# Patient Record
Sex: Male | Born: 1935 | Race: White | Hispanic: No | Marital: Married
Health system: Southern US, Community
[De-identification: ages and names within clinical notes are randomized; demographics above are authoritative.]

---

## 1998-12-17 ENCOUNTER — Other Ambulatory Visit: Admission: RE | Admit: 1998-12-17 | Discharge: 1998-12-17 | Payer: Self-pay | Admitting: Urology

## 1999-01-03 ENCOUNTER — Encounter: Admission: RE | Admit: 1999-01-03 | Discharge: 1999-04-03 | Payer: Self-pay | Admitting: Radiation Oncology

## 2001-01-05 ENCOUNTER — Encounter: Payer: Self-pay | Admitting: *Deleted

## 2001-01-07 ENCOUNTER — Encounter (INDEPENDENT_AMBULATORY_CARE_PROVIDER_SITE_OTHER): Payer: Self-pay | Admitting: *Deleted

## 2001-01-07 ENCOUNTER — Inpatient Hospital Stay (HOSPITAL_COMMUNITY): Admission: RE | Admit: 2001-01-07 | Discharge: 2001-01-08 | Payer: Self-pay | Admitting: *Deleted

## 2001-02-10 ENCOUNTER — Encounter: Payer: Self-pay | Admitting: *Deleted

## 2001-02-10 ENCOUNTER — Encounter: Admission: RE | Admit: 2001-02-10 | Discharge: 2001-02-10 | Payer: Self-pay | Admitting: *Deleted

## 2001-04-30 ENCOUNTER — Encounter: Admission: RE | Admit: 2001-04-30 | Discharge: 2001-04-30 | Payer: Self-pay | Admitting: Thoracic Surgery

## 2001-04-30 ENCOUNTER — Encounter: Payer: Self-pay | Admitting: Thoracic Surgery

## 2001-05-12 ENCOUNTER — Encounter: Payer: Self-pay | Admitting: Thoracic Surgery

## 2001-05-14 ENCOUNTER — Ambulatory Visit (HOSPITAL_COMMUNITY): Admission: RE | Admit: 2001-05-14 | Discharge: 2001-05-14 | Payer: Self-pay | Admitting: Thoracic Surgery

## 2001-05-14 ENCOUNTER — Encounter (INDEPENDENT_AMBULATORY_CARE_PROVIDER_SITE_OTHER): Payer: Self-pay | Admitting: Specialist

## 2001-05-14 ENCOUNTER — Encounter (INDEPENDENT_AMBULATORY_CARE_PROVIDER_SITE_OTHER): Payer: Self-pay

## 2001-10-28 ENCOUNTER — Encounter: Payer: Self-pay | Admitting: Thoracic Surgery

## 2001-10-28 ENCOUNTER — Encounter: Admission: RE | Admit: 2001-10-28 | Discharge: 2001-10-28 | Payer: Self-pay | Admitting: Thoracic Surgery

## 2002-01-26 ENCOUNTER — Encounter: Admission: RE | Admit: 2002-01-26 | Discharge: 2002-01-26 | Payer: Self-pay | Admitting: Thoracic Surgery

## 2002-01-26 ENCOUNTER — Encounter: Payer: Self-pay | Admitting: Thoracic Surgery

## 2002-07-29 ENCOUNTER — Encounter: Admission: RE | Admit: 2002-07-29 | Discharge: 2002-07-29 | Payer: Self-pay | Admitting: Thoracic Surgery

## 2002-07-29 ENCOUNTER — Encounter: Payer: Self-pay | Admitting: Thoracic Surgery

## 2003-12-22 ENCOUNTER — Inpatient Hospital Stay (HOSPITAL_BASED_OUTPATIENT_CLINIC_OR_DEPARTMENT_OTHER): Admission: RE | Admit: 2003-12-22 | Discharge: 2003-12-22 | Payer: Self-pay | Admitting: Cardiology

## 2004-02-05 ENCOUNTER — Inpatient Hospital Stay (HOSPITAL_COMMUNITY): Admission: RE | Admit: 2004-02-05 | Discharge: 2004-02-17 | Payer: Self-pay | Admitting: Surgery

## 2004-03-05 ENCOUNTER — Encounter: Admission: RE | Admit: 2004-03-05 | Discharge: 2004-03-05 | Payer: Self-pay | Admitting: Surgery

## 2004-03-07 ENCOUNTER — Ambulatory Visit: Payer: Self-pay | Admitting: Internal Medicine

## 2004-03-11 ENCOUNTER — Encounter (HOSPITAL_COMMUNITY): Admission: RE | Admit: 2004-03-11 | Discharge: 2004-06-09 | Payer: Self-pay | Admitting: Cardiology

## 2004-03-11 ENCOUNTER — Ambulatory Visit: Payer: Self-pay | Admitting: Cardiology

## 2004-03-14 ENCOUNTER — Ambulatory Visit: Payer: Self-pay | Admitting: Cardiology

## 2004-03-14 ENCOUNTER — Ambulatory Visit: Payer: Self-pay | Admitting: Internal Medicine

## 2004-03-20 ENCOUNTER — Ambulatory Visit (HOSPITAL_COMMUNITY): Admission: RE | Admit: 2004-03-20 | Discharge: 2004-03-20 | Payer: Self-pay | Admitting: Cardiology

## 2004-03-21 ENCOUNTER — Inpatient Hospital Stay (HOSPITAL_COMMUNITY): Admission: EM | Admit: 2004-03-21 | Discharge: 2004-03-25 | Payer: Self-pay | Admitting: Emergency Medicine

## 2004-03-21 ENCOUNTER — Ambulatory Visit: Payer: Self-pay | Admitting: Cardiology

## 2004-03-29 ENCOUNTER — Ambulatory Visit: Payer: Self-pay | Admitting: Internal Medicine

## 2004-03-29 ENCOUNTER — Ambulatory Visit: Payer: Self-pay | Admitting: *Deleted

## 2004-04-11 ENCOUNTER — Ambulatory Visit: Payer: Self-pay | Admitting: Cardiology

## 2004-05-02 ENCOUNTER — Ambulatory Visit: Payer: Self-pay | Admitting: *Deleted

## 2004-05-30 ENCOUNTER — Ambulatory Visit: Payer: Self-pay | Admitting: *Deleted

## 2004-06-07 ENCOUNTER — Ambulatory Visit: Payer: Self-pay | Admitting: Cardiology

## 2004-06-10 ENCOUNTER — Encounter (HOSPITAL_COMMUNITY): Admission: RE | Admit: 2004-06-10 | Discharge: 2004-09-08 | Payer: Self-pay | Admitting: Cardiology

## 2004-06-12 ENCOUNTER — Ambulatory Visit: Payer: Self-pay | Admitting: Cardiology

## 2004-06-24 ENCOUNTER — Ambulatory Visit: Payer: Self-pay | Admitting: *Deleted

## 2004-06-24 ENCOUNTER — Ambulatory Visit: Payer: Self-pay

## 2004-07-04 ENCOUNTER — Ambulatory Visit: Payer: Self-pay | Admitting: Internal Medicine

## 2004-07-18 ENCOUNTER — Ambulatory Visit: Payer: Self-pay | Admitting: Cardiology

## 2004-08-08 ENCOUNTER — Ambulatory Visit: Payer: Self-pay | Admitting: Internal Medicine

## 2004-08-19 ENCOUNTER — Ambulatory Visit: Payer: Self-pay | Admitting: Cardiology

## 2004-08-19 ENCOUNTER — Ambulatory Visit: Payer: Self-pay | Admitting: *Deleted

## 2004-09-09 ENCOUNTER — Ambulatory Visit: Payer: Self-pay | Admitting: Cardiology

## 2004-10-07 ENCOUNTER — Ambulatory Visit: Payer: Self-pay | Admitting: Cardiology

## 2004-10-29 ENCOUNTER — Ambulatory Visit: Payer: Self-pay | Admitting: Family Medicine

## 2004-11-04 ENCOUNTER — Ambulatory Visit: Payer: Self-pay | Admitting: Cardiology

## 2004-11-25 ENCOUNTER — Ambulatory Visit: Payer: Self-pay | Admitting: Internal Medicine

## 2004-12-03 ENCOUNTER — Ambulatory Visit: Payer: Self-pay

## 2004-12-03 ENCOUNTER — Ambulatory Visit: Payer: Self-pay | Admitting: Cardiology

## 2004-12-03 ENCOUNTER — Ambulatory Visit: Payer: Self-pay | Admitting: Internal Medicine

## 2004-12-06 ENCOUNTER — Inpatient Hospital Stay (HOSPITAL_COMMUNITY): Admission: AD | Admit: 2004-12-06 | Discharge: 2004-12-10 | Payer: Self-pay | Admitting: Cardiology

## 2004-12-09 ENCOUNTER — Ambulatory Visit: Payer: Self-pay | Admitting: Internal Medicine

## 2004-12-17 ENCOUNTER — Ambulatory Visit: Payer: Self-pay | Admitting: Cardiology

## 2004-12-25 ENCOUNTER — Ambulatory Visit: Payer: Self-pay

## 2004-12-25 ENCOUNTER — Ambulatory Visit: Payer: Self-pay | Admitting: Internal Medicine

## 2004-12-31 ENCOUNTER — Ambulatory Visit: Payer: Self-pay | Admitting: Cardiology

## 2005-01-21 ENCOUNTER — Ambulatory Visit: Payer: Self-pay | Admitting: Cardiology

## 2005-01-28 ENCOUNTER — Encounter: Admission: RE | Admit: 2005-01-28 | Discharge: 2005-01-28 | Payer: Self-pay | Admitting: Surgery

## 2005-01-31 ENCOUNTER — Ambulatory Visit (HOSPITAL_COMMUNITY)
Admission: RE | Admit: 2005-01-31 | Discharge: 2005-01-31 | Payer: Self-pay | Admitting: Physical Medicine and Rehabilitation

## 2005-02-19 ENCOUNTER — Ambulatory Visit: Payer: Self-pay | Admitting: Cardiology

## 2005-03-11 ENCOUNTER — Encounter: Admission: RE | Admit: 2005-03-11 | Discharge: 2005-03-11 | Payer: Self-pay | Admitting: Surgery

## 2005-03-19 ENCOUNTER — Ambulatory Visit: Payer: Self-pay | Admitting: Cardiology

## 2005-03-31 ENCOUNTER — Ambulatory Visit: Payer: Self-pay | Admitting: Family Medicine

## 2005-04-16 ENCOUNTER — Ambulatory Visit: Payer: Self-pay | Admitting: Cardiology

## 2005-04-22 ENCOUNTER — Ambulatory Visit: Payer: Self-pay | Admitting: Internal Medicine

## 2005-04-25 ENCOUNTER — Ambulatory Visit: Payer: Self-pay | Admitting: Cardiology

## 2005-05-02 ENCOUNTER — Ambulatory Visit: Payer: Self-pay | Admitting: Cardiology

## 2005-05-12 ENCOUNTER — Ambulatory Visit: Payer: Self-pay | Admitting: Cardiology

## 2005-05-26 ENCOUNTER — Ambulatory Visit: Payer: Self-pay | Admitting: Cardiology

## 2005-05-27 ENCOUNTER — Ambulatory Visit: Payer: Self-pay | Admitting: Internal Medicine

## 2005-05-30 ENCOUNTER — Ambulatory Visit: Payer: Self-pay | Admitting: Internal Medicine

## 2005-05-30 ENCOUNTER — Ambulatory Visit (HOSPITAL_COMMUNITY): Admission: RE | Admit: 2005-05-30 | Discharge: 2005-05-30 | Payer: Self-pay | Admitting: Internal Medicine

## 2005-06-05 ENCOUNTER — Ambulatory Visit: Payer: Self-pay | Admitting: Cardiology

## 2005-06-18 ENCOUNTER — Ambulatory Visit: Payer: Self-pay | Admitting: Internal Medicine

## 2005-06-19 ENCOUNTER — Ambulatory Visit: Payer: Self-pay | Admitting: Internal Medicine

## 2005-06-20 ENCOUNTER — Ambulatory Visit: Payer: Self-pay

## 2005-07-10 ENCOUNTER — Ambulatory Visit: Payer: Self-pay | Admitting: Cardiology

## 2005-07-16 ENCOUNTER — Ambulatory Visit: Payer: Self-pay | Admitting: Family Medicine

## 2005-07-22 ENCOUNTER — Ambulatory Visit: Payer: Self-pay | Admitting: Cardiology

## 2005-07-22 ENCOUNTER — Ambulatory Visit: Payer: Self-pay | Admitting: Family Medicine

## 2005-07-24 ENCOUNTER — Ambulatory Visit: Payer: Self-pay | Admitting: Gastroenterology

## 2005-08-05 ENCOUNTER — Ambulatory Visit: Payer: Self-pay | Admitting: Cardiology

## 2005-08-12 ENCOUNTER — Ambulatory Visit: Payer: Self-pay | Admitting: Cardiology

## 2005-08-13 ENCOUNTER — Ambulatory Visit: Payer: Self-pay | Admitting: *Deleted

## 2005-08-14 ENCOUNTER — Ambulatory Visit: Payer: Self-pay | Admitting: Gastroenterology

## 2005-08-20 ENCOUNTER — Ambulatory Visit: Payer: Self-pay | Admitting: Cardiovascular Disease

## 2005-08-20 ENCOUNTER — Ambulatory Visit: Payer: Self-pay | Admitting: *Deleted

## 2005-08-20 ENCOUNTER — Ambulatory Visit: Payer: Self-pay | Admitting: Gastroenterology

## 2005-09-03 ENCOUNTER — Ambulatory Visit: Payer: Self-pay | Admitting: Critical Care Medicine

## 2005-09-04 ENCOUNTER — Ambulatory Visit: Payer: Self-pay | Admitting: Cardiology

## 2005-09-08 ENCOUNTER — Ambulatory Visit: Payer: Self-pay | Admitting: Cardiology

## 2005-09-18 ENCOUNTER — Ambulatory Visit: Payer: Self-pay | Admitting: Gastroenterology

## 2005-09-18 ENCOUNTER — Ambulatory Visit: Payer: Self-pay | Admitting: Critical Care Medicine

## 2005-09-25 ENCOUNTER — Ambulatory Visit: Payer: Self-pay | Admitting: Internal Medicine

## 2005-09-30 ENCOUNTER — Ambulatory Visit: Payer: Self-pay | Admitting: Cardiology

## 2005-10-07 ENCOUNTER — Ambulatory Visit (HOSPITAL_COMMUNITY): Admission: RE | Admit: 2005-10-07 | Discharge: 2005-10-07 | Payer: Self-pay | Admitting: Internal Medicine

## 2005-10-07 ENCOUNTER — Ambulatory Visit: Payer: Self-pay | Admitting: Internal Medicine

## 2005-10-20 ENCOUNTER — Ambulatory Visit: Payer: Self-pay | Admitting: Critical Care Medicine

## 2005-10-28 ENCOUNTER — Ambulatory Visit: Payer: Self-pay | Admitting: Cardiovascular Disease

## 2005-11-04 ENCOUNTER — Ambulatory Visit: Payer: Self-pay | Admitting: Internal Medicine

## 2005-11-13 ENCOUNTER — Ambulatory Visit: Payer: Self-pay

## 2005-11-17 ENCOUNTER — Ambulatory Visit: Payer: Self-pay | Admitting: Internal Medicine

## 2005-11-17 ENCOUNTER — Inpatient Hospital Stay (HOSPITAL_COMMUNITY): Admission: RE | Admit: 2005-11-17 | Discharge: 2005-11-18 | Payer: Self-pay | Admitting: Internal Medicine

## 2005-11-25 ENCOUNTER — Ambulatory Visit: Payer: Self-pay | Admitting: Cardiology

## 2005-11-26 ENCOUNTER — Ambulatory Visit: Payer: Self-pay | Admitting: Internal Medicine

## 2005-11-28 ENCOUNTER — Ambulatory Visit: Payer: Self-pay | Admitting: Gastroenterology

## 2005-12-10 ENCOUNTER — Ambulatory Visit: Payer: Self-pay | Admitting: Internal Medicine

## 2005-12-10 ENCOUNTER — Ambulatory Visit: Payer: Self-pay | Admitting: Cardiology

## 2005-12-19 ENCOUNTER — Ambulatory Visit: Payer: Self-pay | Admitting: Cardiology

## 2005-12-24 ENCOUNTER — Ambulatory Visit: Payer: Self-pay | Admitting: Cardiology

## 2005-12-29 ENCOUNTER — Ambulatory Visit: Payer: Self-pay | Admitting: Internal Medicine

## 2005-12-29 ENCOUNTER — Ambulatory Visit: Payer: Self-pay | Admitting: Cardiology

## 2005-12-29 ENCOUNTER — Ambulatory Visit: Payer: Self-pay

## 2005-12-31 ENCOUNTER — Ambulatory Visit: Payer: Self-pay | Admitting: Internal Medicine

## 2006-01-07 ENCOUNTER — Ambulatory Visit (HOSPITAL_COMMUNITY): Admission: RE | Admit: 2006-01-07 | Discharge: 2006-01-07 | Payer: Self-pay | Admitting: Internal Medicine

## 2006-01-08 ENCOUNTER — Ambulatory Visit: Payer: Self-pay | Admitting: Cardiology

## 2006-01-29 ENCOUNTER — Ambulatory Visit: Payer: Self-pay | Admitting: Cardiology

## 2006-02-12 ENCOUNTER — Ambulatory Visit: Payer: Self-pay | Admitting: Internal Medicine

## 2006-02-25 ENCOUNTER — Ambulatory Visit: Payer: Self-pay | Admitting: Internal Medicine

## 2006-02-25 ENCOUNTER — Ambulatory Visit: Payer: Self-pay | Admitting: *Deleted

## 2006-03-06 ENCOUNTER — Ambulatory Visit: Payer: Self-pay | Admitting: Gastroenterology

## 2006-03-06 LAB — CONVERTED CEMR LAB
ALT: 24 units/L (ref 0–40)
Bilirubin, Direct: 0.3 mg/dL (ref 0.0–0.3)
Total Bilirubin: 1 mg/dL (ref 0.3–1.2)

## 2006-03-10 ENCOUNTER — Ambulatory Visit: Payer: Self-pay | Admitting: Gastroenterology

## 2006-03-18 ENCOUNTER — Ambulatory Visit: Payer: Self-pay | Admitting: Cardiology

## 2006-04-08 ENCOUNTER — Ambulatory Visit: Payer: Self-pay | Admitting: Cardiology

## 2006-04-17 ENCOUNTER — Ambulatory Visit (HOSPITAL_BASED_OUTPATIENT_CLINIC_OR_DEPARTMENT_OTHER): Admission: RE | Admit: 2006-04-17 | Discharge: 2006-04-17 | Payer: Self-pay | Admitting: Critical Care Medicine

## 2006-05-01 ENCOUNTER — Ambulatory Visit: Payer: Self-pay | Admitting: Cardiology

## 2006-05-14 ENCOUNTER — Ambulatory Visit: Payer: Self-pay | Admitting: Pulmonary Disease

## 2006-05-14 ENCOUNTER — Ambulatory Visit: Payer: Self-pay | Admitting: Cardiology

## 2006-05-21 ENCOUNTER — Ambulatory Visit: Payer: Self-pay

## 2006-05-21 ENCOUNTER — Ambulatory Visit: Payer: Self-pay | Admitting: *Deleted

## 2006-05-25 ENCOUNTER — Ambulatory Visit: Payer: Self-pay | Admitting: Pulmonary Disease

## 2006-05-29 ENCOUNTER — Ambulatory Visit: Payer: Self-pay | Admitting: Cardiology

## 2006-06-17 ENCOUNTER — Ambulatory Visit: Payer: Self-pay | Admitting: Pulmonary Disease

## 2006-06-17 ENCOUNTER — Ambulatory Visit (HOSPITAL_BASED_OUTPATIENT_CLINIC_OR_DEPARTMENT_OTHER): Admission: RE | Admit: 2006-06-17 | Discharge: 2006-06-17 | Payer: Self-pay | Admitting: Pulmonary Disease

## 2006-06-19 ENCOUNTER — Ambulatory Visit: Payer: Self-pay | Admitting: Internal Medicine

## 2006-06-22 ENCOUNTER — Ambulatory Visit: Payer: Self-pay | Admitting: Cardiology

## 2006-07-10 ENCOUNTER — Ambulatory Visit: Payer: Self-pay | Admitting: Cardiology

## 2006-07-20 ENCOUNTER — Ambulatory Visit: Payer: Self-pay | Admitting: Pulmonary Disease

## 2006-08-07 ENCOUNTER — Ambulatory Visit: Payer: Self-pay | Admitting: Cardiology

## 2006-08-20 ENCOUNTER — Ambulatory Visit: Payer: Self-pay | Admitting: Family Medicine

## 2006-08-20 LAB — CONVERTED CEMR LAB
ALT: 25 units/L (ref 0–40)
Albumin: 3 g/dL — ABNORMAL LOW (ref 3.5–5.2)
BUN: 27 mg/dL — ABNORMAL HIGH (ref 6–23)
Basophils Absolute: 0 10*3/uL (ref 0.0–0.1)
Bilirubin, Direct: 0.3 mg/dL (ref 0.0–0.3)
CO2: 37 meq/L — ABNORMAL HIGH (ref 19–32)
Chloride: 95 meq/L — ABNORMAL LOW (ref 96–112)
Creatinine, Ser: 1.5 mg/dL (ref 0.4–1.5)
Eosinophils Relative: 1 % (ref 0.0–5.0)
Ferritin: 127.7 ng/mL (ref 22.0–322.0)
Folate: 19.9 ng/mL
GFR calc Af Amer: 57 mL/min
Hemoglobin: 10.8 g/dL — ABNORMAL LOW (ref 13.0–17.0)
MCHC: 33.9 g/dL (ref 30.0–36.0)
MCV: 89.3 fL (ref 78.0–100.0)
Monocytes Absolute: 0.5 10*3/uL (ref 0.2–0.7)
Monocytes Relative: 9.4 % (ref 3.0–11.0)
Neutro Abs: 4.5 10*3/uL (ref 1.4–7.7)
Neutrophils Relative %: 78.7 % — ABNORMAL HIGH (ref 43.0–77.0)
Potassium: 4.9 meq/L (ref 3.5–5.1)
Sodium: 138 meq/L (ref 135–145)
TSH: 3.51 microintl units/mL (ref 0.35–5.50)
Total Bilirubin: 0.8 mg/dL (ref 0.3–1.2)
Total Protein: 6.8 g/dL (ref 6.0–8.3)

## 2006-08-21 ENCOUNTER — Ambulatory Visit: Payer: Self-pay | Admitting: Cardiology

## 2006-08-26 ENCOUNTER — Ambulatory Visit: Payer: Self-pay | Admitting: Family Medicine

## 2006-08-26 LAB — CONVERTED CEMR LAB
LDL Cholesterol: 100 mg/dL — ABNORMAL HIGH (ref 0–99)
VLDL: 9 mg/dL (ref 0–40)

## 2006-08-27 ENCOUNTER — Ambulatory Visit: Payer: Self-pay | Admitting: Oncology

## 2006-08-31 ENCOUNTER — Ambulatory Visit: Payer: Self-pay | Admitting: Family Medicine

## 2006-09-07 ENCOUNTER — Ambulatory Visit: Payer: Self-pay | Admitting: Cardiovascular Disease

## 2006-09-07 ENCOUNTER — Ambulatory Visit: Payer: Self-pay | Admitting: Cardiology

## 2006-09-22 ENCOUNTER — Ambulatory Visit: Payer: Self-pay

## 2006-09-29 ENCOUNTER — Ambulatory Visit: Payer: Self-pay | Admitting: Cardiology

## 2006-10-01 ENCOUNTER — Ambulatory Visit: Payer: Self-pay | Admitting: Cardiology

## 2006-10-12 ENCOUNTER — Ambulatory Visit: Payer: Self-pay | Admitting: Cardiovascular Disease

## 2006-10-12 LAB — CONVERTED CEMR LAB
BUN: 35 mg/dL — ABNORMAL HIGH (ref 6–23)
Creatinine, Ser: 1.5 mg/dL (ref 0.4–1.5)
Glucose, Bld: 89 mg/dL (ref 70–99)
Potassium: 4.5 meq/L (ref 3.5–5.1)

## 2006-10-16 ENCOUNTER — Ambulatory Visit: Payer: Self-pay | Admitting: Internal Medicine

## 2006-10-16 ENCOUNTER — Inpatient Hospital Stay (HOSPITAL_COMMUNITY): Admission: EM | Admit: 2006-10-16 | Discharge: 2006-10-21 | Payer: Self-pay | Admitting: Emergency Medicine

## 2006-11-03 DEATH — deceased

## 2007-07-29 IMAGING — PT NM PET TUM IMG SKULL BASE T - THIGH
4 series · 25 of 25 positions shown · non-contrast
Comparison: CT dated 01/28/05.

CLINICAL DATA: Bilateral lung nodules.  Lung mass.
 FDG PET-CT TUMOR IMAGING (SKULL BASE TO THIGHS) ? 01/31/05: 
 Fasting Blood Glucose:  88.
TECHNIQUE: 16.2 mCi F18-FDG were administered via the right antecubital fossa.  Full ring PET imaging was performed from the skull base through the mid-thighs 64 minutes after injection.  CT data was obtained and used for attenuation correction and anatomic localization only.  (This was not acquired as a diagnostic CT examination.)

[Series 1: pet ac · axial · 3.3mm · 4.69mm/px · z∈[-870,+0]mm · 8 of 267 slices shown]
[im 1/267]
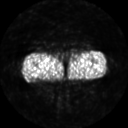
[im 39/267]
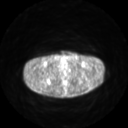
[im 77/267]
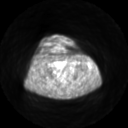
[im 115/267]
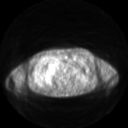
[im 153/267]
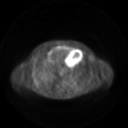
[im 191/267]
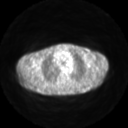
[im 229/267]
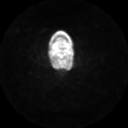
[im 267/267]
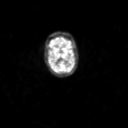

[Series 2: pet nac · axial · 3.3mm · 4.69mm/px · z∈[-870,+0]mm · 8 of 267 slices shown]
[im 1/267]
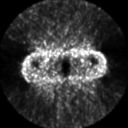
[im 39/267]
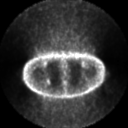
[im 77/267]
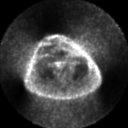
[im 115/267]
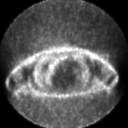
[im 153/267]
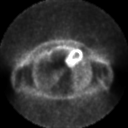
[im 191/267]
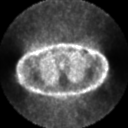
[im 229/267]
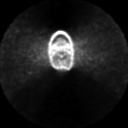
[im 267/267]
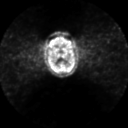

[Series 2: ct images · axial · 3.8mm · 0.98mm/px · z∈[-870,+0]mm · 8 of 267 slices shown]
[im 1/267]
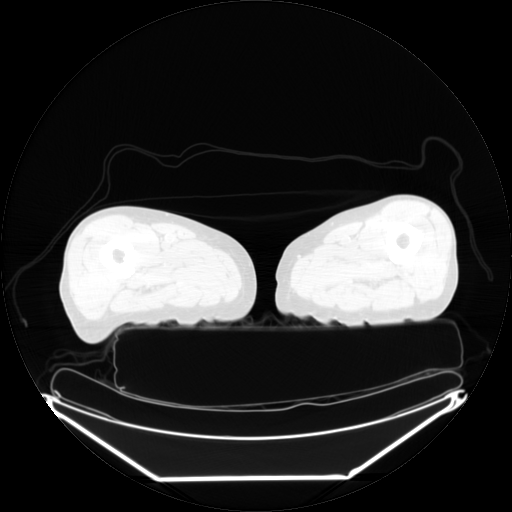
[im 39/267]
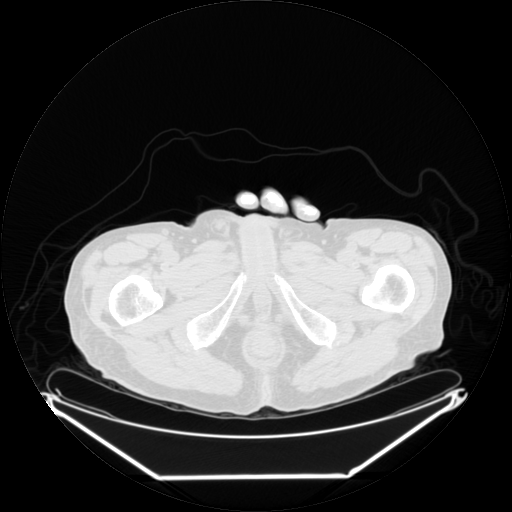
[im 77/267]
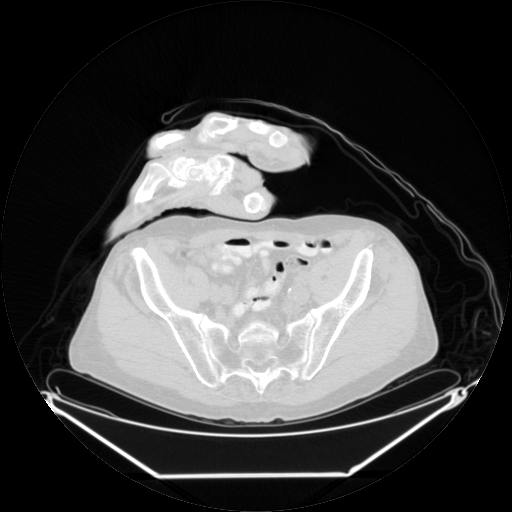
[im 115/267]
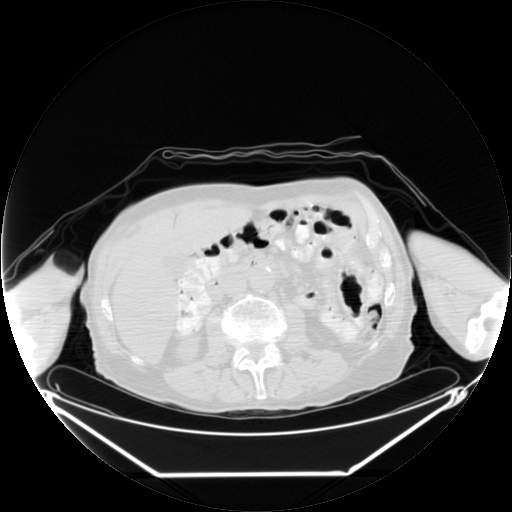
[im 153/267]
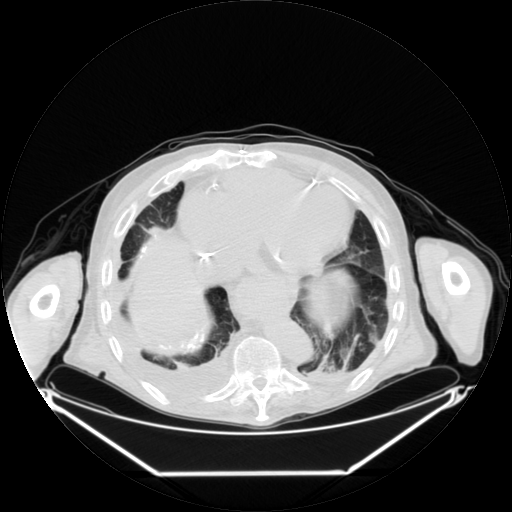
[im 191/267]
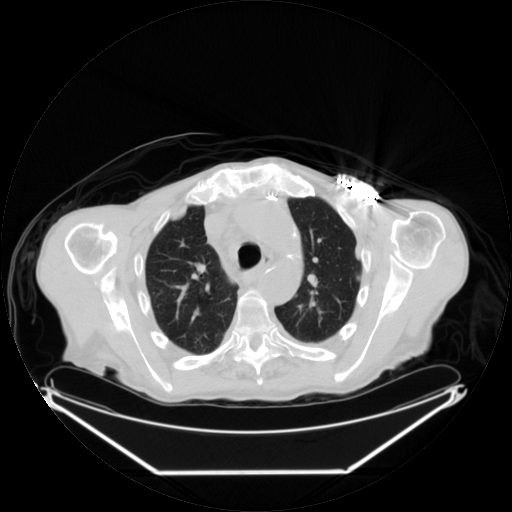
[im 229/267]
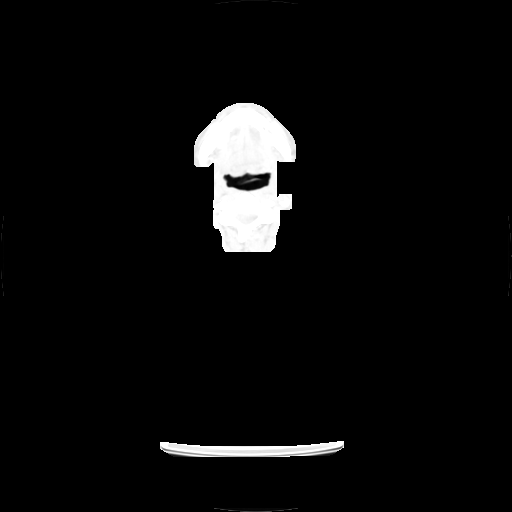
[im 267/267  brain]
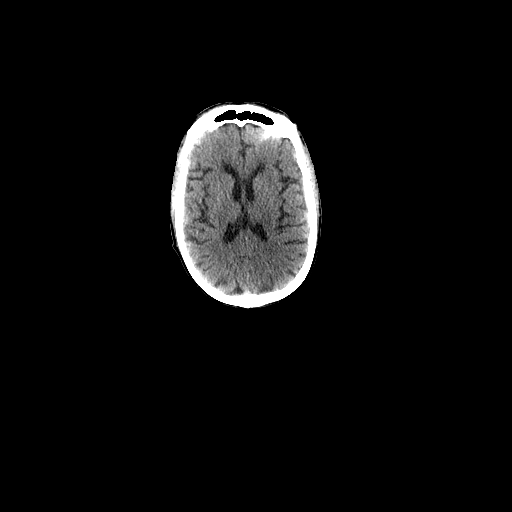

[Series 123: mip · coronal · 3.3mm · 4.69mm/px · 1 of 30 slices shown]
[im 1/30]
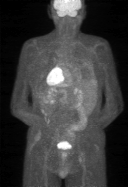

[25 of 25 positions shown; findings below may reference images not displayed]

FINDINGS: No hypermetabolic lymph nodes within the soft tissues of the neck.  
 No hypermetabolic axillary lymphadenopathy. 
 A right paratracheal lymph node demonstrates no significant FDG uptake.  
 AP window lymph nodes are noted.  These demonstrate mild nonspecific FDG uptake with an SUV-max equal to 3.8.  
 No hypermetabolic hilar lymphadenopathy.
 Bilateral pleural effusions, right greater than left, are noted.  No abnormal uptake within the effusions are identified to suggest that these are malignant effusions.
 Linear increased radiotracer uptake involving the right pleura is noted.  The SUV for this equals 3.0.  This is likely the sequela of chronic inflammation possibly due to asbestos exposure.  There are no focal areas of more greater increased uptake. 
 No abnormal uptake in the liver is noted. 
 Mild nonspecific FDG uptake is identified within the left adrenal gland.
IMPRESSION: 1.  No hypermetabolic pulmonary nodule or mass is noted.  
 2.  Increased FDG uptake along the pleura of the right lung.  There is a medium-size pleural effusion overlying this area.  The pleural effusion does not have any significant FDG uptake though.  I suspect, given the calcified pleural plaques throughout both lungs, this patient has had exposure to asbestos.  This FDG uptake may be related to long-standing inflammation as a result of this exposure.  However, if this patient?s pleural effusion does not resolve, it may be of value to perform a histologic analysis of the effusion to ensure that there are no tumor cells which may suggest an early mesothelioma.

## 2010-09-17 NOTE — Assessment & Plan Note (Signed)
[facility]                            CARDIOLOGY OFFICE NOTE   NAME:[name] [name] RENDLEMAN                       MRN:          [id number]  DATE:10/01/2006                            DOB:          Nov 23, 1918    Mr. [name] [name] comes in today for close followup and management of the  following issues:  1. Right-sided congestive heart failure with increasing edema, fatigue      and shortness of breath.  A repeat 2D echocardiogram on Sep 22, 2006, confirms normal left ventricular systolic function, moderate      aortic stenosis with a mean gradient of 35, mild mitral      regurgitation, marked enlargement of left atrium, RV dilatation      with reduction RV function, markedly increased pulmonary pressures,      severe TR and marked enlargement of the right atrium.  His swelling      is better with b.i.d. Lasix but it keeps him up at night.  He is      not wearing his CPAP for his apnea.  2. Coronary artery disease.  Currently stable with good LV function.  3. Moderate aortic stenosis which may be part of the reason he is      short of breath but not the primary.  4. Atrial fibrillation.  He is AV paced and is currently controlled      with amiodarone and Coumadin.  5. Chronic anemia.  6. Obstructive sleep apnea.  He does not enjoy wearing a CPAP because      of having to get up during the night.   He is absolutely fatigued.  Even shaving makes him short of breath.   His edema is better as mentioned above.   His wife asked about O2 at night but his room air O2 saturation is 94%.  I made it clear to her that he would not meet criteria for Medicare and  it was extraordinarily extensive.  He would like to stick with the CPAP  for present, which he promises to wear.   His medications are unchanged as outlined in a note Sep 07, 2006.   PHYSICAL EXAMINATION:  He is pale.  SKIN:  Warm and dry.  NECK:  Really does not show any significant jugular venous  distention at  30 degrees.  His respiratory rate is 18 and unlabored.  Carotids are  full with bilateral referred sounds.  Thyroid is not enlarged.  Trachea  is midline.  LUNGS:  Reveal decreased breath sounds bilaterally right worse than  left.  This is stable.  HEART:  Reveals an aortic stenosis murmur.  There is no gallop.  There  is a right ventricular lift.  ABDOMEN:  Soft.  Liver is not enlarged.  There are good bowel sounds.  EXTREMITIES:  Reveal 1+ edema on the left; none on the right, which is  the best I have seen it.  Pulses are present but reduced.  NEUROLOGICAL EXAMINATION:  Grossly intact.   EKG shows AV sequential pacing with no changes.   I have  spent a good 20 to 30 minutes talking to Mr. And Mrs. [name] [name]  again.  He and she are very frustrated that he is getting no better.  They are pleased that his swelling is better.   I have made it clear there is very little to do about right ventricular  failure and right-sided failure other than what we are doing.  We will  add Lanoxin 0.125 mg a day, and I have encouraged him to wear a CPAP  every night.  I have asked him to take his p.m. dose of Lasix earlier in  the afternoon to avoid to limit nocturia.  I will see him back in four  weeks.     Thomas C. Daleen Squibb, MD, [facility]  Electronically Signed    TCW/MedQ  DD: 10/01/2006  DT: 10/01/2006  Job #: [id number]   cc:   [name] Senior A. Clent Ridges, MD

## 2010-09-17 NOTE — H&P (Signed)
[name], [name] NO.:  [id number]   MEDICAL RECORD NO.:  [id number]          PATIENT TYPE:  INP   LOCATION:  1824                         FACILITY:  [facility]   PHYSICIAN:  Hollice Espy, M.D.DATE OF BIRTH:  1918/09/20   DATE OF ADMISSION:  10/15/2006  DATE OF DISCHARGE:                              HISTORY & PHYSICAL   PRIMARY CARE PHYSICIAN:  Tera Mater. Clent Ridges, M.D.   CARDIOLOGIST:  Jesse Sans. Wall, MD, [facility].   CHIEF COMPLAINT:  Weakness.   HISTORY OF PRESENT ILLNESS:  The patient is an 77 year old white male  with past medical history of atrial fibrillation, status post pacer,  right-sided congestive heart failure, with moderate aortic stenosis and  severe tricuspid regurgitation, CAD, and chronic anemia who has been  having problems with increased lower extremity swelling to the point  where his Lasix was recently doubled about one to two months ago.  Since  that time, he reports more so in the last week that he has been having  problems with weakening to the point where he cannot even get out of  bed.  He reports some continued shortness of breath but this does not  appear to be a major complaint.  He says that his cough is usually of  whitish sputum but usually nonproductive.  He does not note any fevers  or chills.  He finally got to the point where he just could not even  barely move and he came to the emergency room for further evaluation.  When the patient presented here, he was noted to have a 3-4+ pitting  edema of the lower extremity which has been a chronic issue for him.  However, concerning was his blood work which showed a normal white count  of 8.1 but with an 82% shift. He was also noted to have a sodium of 128,  CO2 above 35 and a BUN of 43 with a creatinine of 1.5.  Cardiac markers  are unremarkable.  A chest x-ray showed signs of a __________ one-sided  pleural effusion but also bibasilar atelectasis and a possible right  lower lobe  infiltrate.  In discussion with the patient, his biggest  complaint is just fatigue and weakness.  He denies any headaches, vision  changes, chest pain, or palpitations.  He did have chronic shortness of  breath and dyspnea on exertion with some nonproductive coughing.  No  wheezing.  He denies any abdominal pain. His bowels have not moved for  several days, but had a large bowel  movement today.  He denies any  diarrhea.  He denies any hematuria or dysuria.  He does complain of  bilateral lower extremity swelling. His review of systems is otherwise  negative.   PAST MEDICAL HISTORY:  1. CHF.  2. Atrial fibrillation.  3. Pacemaker.  4. Gout.  5. Severe tricuspid regurgitation.  6. Moderate aortic stenosis.  7. GERD.  8. The patient also has history of obstructive sleep apnea on CPAP.   MEDICATIONS:  Based on the [facility] note from Sep 07, 2006, he  is on:  1.  Prilosec 30 mg.  2. Coreg 6.25 mg p.o. b.i.d.  3. Amiodarone 200 mg p.o. daily.  4. Aspirin 81 mg p.o. daily  5. Coumadin as directed.  The last recorded Coumadin level that I have      is 5 mg per day on his discharge from last year.  6. Iron sulfate 275 mg p.o. b.i.d.  7. Digoxin 0.125 mg p.o. daily  8. Ocuvite p.o. daily/  9. Multivitamins p.o. daily.  10.Metamucil p.o. daily.  11.K-Dur 20 mEq p.o. daily.  12.MiraLax p.o. daily.  13.Lasix 40 mg p.o. b.i.d.   ALLERGIES:  INDOMETHACIN.   SOCIAL HISTORY:  He denies tobacco, alcohol or drug use.  He lives at  home with his wife.   FAMILY HISTORY:  Noncontributory.   PHYSICAL EXAMINATION:  VITAL SIGNS:  On admission, temperature 97.5,  heart rate 60, blood pressure 179/72, respirations 18, oxygen saturation  88% on room air, 93% on 2 L.  GENERAL:  The patient is alert and oriented x3, but extremely fatigued  in appearance.  HEENT:  Normocephalic, atraumatic.  Mucous membranes are quite dry.  He  has no carotid bruits.  HEART:  Paced rhythm.  S1 and S2  with a 2/6 systolic ejection murmur.  LUNGS:  Decreased breath sounds bilaterally at the bases.  ABDOMEN:  Soft, nontender, nondistended.  Decreased bowel sounds.  EXTREMITIES:  From the knees down bilaterally, he has a 3-4+ pitting  edema.   LABORATORY DATA:  White count 8.1, hemoglobin 12.2, hematocrit 36.5, MCV  90. Platelet count 165, 82% neutrophils.  Urinalysis showed a trace  hemoglobin and 30 protein.  Sodium 128, potassium 4.8, chloride 86,  bicarb 35, BUN 43, creatinine 1.49, glucose 104.  LFTs are noted for an  albumin of 3.  His urine micro was essentially unremarkable.  CPK 146,  MB 1.5, troponin-I less than 0.05.  EKG shows a paced rhythm.  Chest x-  ray is as per HPI.   ASSESSMENT/PLAN:  1. Progressive worsening weakness.  I feel  likely this is secondary      to overdiuresis in combination with deconditioning and poor      nutrition.  The cause of his overdiuresis is an increase in his      Lasix.  Would start by admitting the patient.  Will gently hydrate,      get PT/OT involved as well as nutrition consult.  2. Suspected infiltrate.  The patient may have a mild pneumonia      although fever and chills are not his main complaint.  He does have      a possible infiltrate on x-ray.  He does have a shift on his white      count. We will go ahead and treat now, given his co-morbidities,      with IV Avelox.  3. Acute and chronic renal failure.  Again will gently hydrate.  4. History of chronic systolic congestive heart failure.  His history      does not impress me much for acute failure.  For now, until we get      more favored gentle hydrating and holding of Lasix, until his renal      status has improved, we will continue his digoxin.  5. Atrial fibrillation, status post pacer.  Continue amiodarone.  6. Protein/calorie malnutrition.  We will get a nutrition consult.  I     have a feeling that perhaps some of his severe lower extremity  edema may also be from  third spacing as well.      Hollice Espy, M.D.  Electronically Signed     SKK/MEDQ  D:  10/16/2006  T:  10/16/2006  Job:  [id number]   cc:   Jeannett Senior A. Clent Ridges, MD  Jesse Sans. Wall, MD, [facility]

## 2010-09-20 NOTE — Cardiovascular Report (Signed)
NAME:  AZARIAN, STARACE                          ACCOUNT NO.:  [id number]   MEDICAL RECORD NO.:  [id number]                   PATIENT TYPE:  OIB   LOCATION:  6501                                 FACILITY:  [facility]   PHYSICIAN:  Charlies Constable, M.D. [facility]              DATE OF BIRTH:  1918/11/24   DATE OF PROCEDURE:  12/22/2003  DATE OF DISCHARGE:                              CARDIAC CATHETERIZATION   CLINICAL HISTORY:  Mr. Dobratz is 77 years old and has had recent onset chest  pain and exertional angina. Seen in consultation by Dr. Daleen Squibb, and Dr. Daleen Squibb  made arrangements for him to come to the outpatient lab for evaluation with  angiography. He also had some mild aortic insufficiency by echocardiography  and some calcification in the aortic valve as well as a dilated root. He had  a Cardiolite scan which showed some inferior scarring with peri-infarction  ischemia and ejection fraction of 63%.   PROCEDURE:  The procedure was performed in the outpatient laboratory.  Procedure was performed via the right femoral artery using an arterial  sheath and initially 4-French preformed coronary catheters. We had a great  deal of difficulty engaging the left coronary artery due to marked  tortuosity and dilatation of the aortic root and due to rotation of his  aorta changing the take off angle of the left main. We finally had to switch  to 5-French catheters and were able to get a fairly good subselective  injection with a JL 3.5 catheter. Aortic root injection was performed to  assess the patient's dilated aortic root and also his aortic insufficiency.  The patient tolerated the procedure well and left the laboratory in  satisfactory condition.   RESULTS:  Left main coronary artery:  The left main coronary artery had a  70% to 80% lesion with calcifications ostium to the bifurcation.   Left anterior descending artery:  The left anterior descending artery was  heavily calcified. This also gave rise to 2  diagonal branches and 2 septal  perforators. There was 80% narrowing in the proximal LAD visualized on a  subselective injection.   Left circumflex artery:  The left circumflex artery gave raise to a small  marginal branch and a large posterolateral branch. This vessel was also  heavily calcified. There was a 70% ostial lesion and an 80% lesion in the  proximal portion which were visualized on subselective injection.   Right coronary artery:  The right coronary artery is a dominant vessel that  gave rise to 2 right ventricular branches, a posterior descending branch,  and 2 posterolateral branches. The posterior descending branch was diffusely  disease with 70% proximal narrowing and 95% narrowing in the mid to distal  portion. There was a 70% lesion before the second posterior lateral branch.   Left ventriculogram:  Left ventriculogram performed in the RAO projection  showed good wall motion with no areas of  hypokinesis. The estimated ejection  fraction was 60%.   Aortic root injection showed a dilated and tortuous aorta. There was trace  aortic insufficiency.   The aortic pressure was 131/59 with mean of 85 and left ventricular pressure  was 131/18. There was no gradient across the aortic valve at time of pull  back.   CONCLUSION:  Coronary artery disease with 70 to 80% of the left main  coronary artery, 80% stenosis of the proximal left anterior descending  artery, 70% ostial and 80% proximal stenosis in the circumflex artery, and  95% stenosis in the posterior stenosis branch and 70% in the posterolateral  branch of the right coronary artery with normal LV function.   RECOMMENDATIONS:  The only option for revascularization is surgical. The  patient is 77 years old and will be at increased risk. He also has heavily  calcified vessels. I discussed his situation with Dr. Daleen Squibb, and we will  arrange for him to see Dr. Daleen Squibb in the office in followup and discuss  whether we think he  would be a candidate for bypass surgery. In the  meantime, we will continue the aspirin and add Imdur 30 mg daily.                                               Charlies Constable, M.D. [facility]    BB/MEDQ  D:  12/22/2003  T:  12/23/2003  Job:  [id number]   cc:   Thomas C. Wall, M.D.

## 2010-09-20 NOTE — Procedures (Signed)
[name], VICKERS NO.:  [id number]   MEDICAL RECORD NO.:  [id number]          PATIENT TYPE:  OUT   LOCATION:  SLEEP CENTER                 FACILITY:  [facility]   PHYSICIAN:  Coralyn Helling, MD        DATE OF BIRTH:  05-01-1919   DATE OF STUDY:  06/17/2006                            NOCTURNAL POLYSOMNOGRAM   INDICATION FOR STUDY:  This an individual who has history of coronary  disease and hypertension. He had undergone a overnight polysomnogram on  April 17, 2006 and was found to have a AHI of 10 and an oxygen  saturation nadir of 89%. Of note was that he did have a significant REM  affect. He returns to the sleep lab for a CPAP titration study.   EPWORTH SLEEPINESS SCORE:  Eight.   MEDICATIONS:  Prilosec, carvedilol, amiodarone, aspirin, Coumadin,  ferrous sulfate, Ocu-Vite, a multivitamin and Metamucil.   SLEEP ARCHITECTURE:  Total recording time was 426 minutes. Total sleep  time was 158 minutes. Sleep efficiency was 37% which is significantly  reduced. Sleep latency was 4.5 minutes which is reduced. REM latency was  150 minutes which is within normal limits. The study was notable for the  lack of slow wave sleep and a reduction in the percentage of REM sleep  to 2% of the study. The patient slept in both the supine and non supine  position.   RESPIRATORY DATA:  The average respiratory rate was 16. The patient was  titrated from a CPAP pressure setting of 5 to 10 cm of water. At a CPAP  pressure setting of 9 cm of water the apnea/hypopnea index reduced to  4.6. At this pressure setting the patient was observed in both REM sleep  and supine sleep. However his sleep was quite disrupted throughout the  study, particularly in the second half of the study when his pressure  was increased to 10.   OXYGEN DATA:  The baseline oxygenation was 94%. The oxygen saturation  nadir was 83%. At a CPAP pressure setting of 9 cm of water, the mean  oxygenation during non  REM sleep was 92% and during REM sleep was 91%.  The minimal during non REM sleep was 83% and 84% during REM sleep.   CARDIAC DATA:  The average heart rate was 62 and rhythm strip showed  sinus rhythm with PACs.   MOVEMENT-PARASOMNIA:  The periodic movement index was 0. The patient had  two bathroom trips.   IMPRESSIONS-RECOMMENDATIONS:  This was a CPAP titration study.  At a  CPAP pressure setting of 9 cm of water, the apnea/hypopnea index was  reduced to 4.6. The patient was observed in both REM sleep and supine  sleep at this pressure setting; however, sleep architecture was quite  fragmented and as a result total monitor testing time was somewhat  limited. I would recommend starting the patient on CPAP 9 cm of water  and monitoring for his clinical response, then further consideration  could be given to either having him undergo an auto CPAP titration  once he is more acclimatized to the use of CPAP therapy or alternatively  return  to the sleep lab for a repeat CPAP titration study.      Coralyn Helling, MD  Diplomat, American Board of Sleep Medicine  Electronically Signed     VS/MEDQ  D:  06/25/2006 14:58:02  T:  06/25/2006 19:27:20  Job:  [id number]

## 2010-09-20 NOTE — Procedures (Signed)
NAMETAVISH, GETTIS NO.:  [id number]   MEDICAL RECORD NO.:  [id number]          PATIENT TYPE:  OUT   LOCATION:  SLEEP CENTER                 [facility]ACILITY[facility]   PHYSICIAN:  Barbaraann Share, MD,FCCPDATE OF BIRTH:  11/04/20   DATE OF STUDY:  04/17/2006                            NOCTURNAL POLYSOMNOGRAM   REFERRING PHYSICIAN:  Marcelyn Bruins MD   INDICATION FOR STUDY:  Hypersomnia with sleep apnea.   EPWORTH SLEEPINESS SCORE:  10   SLEEP ARCHITECTURE:  The patient had a total sleep time of 331 minutes  with decreased REM and never achieved slow wave sleep. Sleep onset  latency was normal and REM onset was fairly rapid at 57 minutes. Sleep  efficiency was decreased at 76%.   RESPIRATORY DATA:  The patient was found to have 29 hypopnea's and 24  obstructive apnea's for a respiratory disturbance index of approximately  10 events per hour. The events occurred in all body positions and  moderate to loud snoring was noted throughout. The patient was noted to  have extremely fragmented sleep toward the end of the study when his  sleep disorder breathing worsened.   OXYGEN DATA:  Oxygen desaturation as low as 89% with the patient's  obstructive events.   CARDIAC DATA:  The patient had a paced rhythm throughout his study with  no obvious arrhythmia.   MOVEMENT-PARASOMNIA:  None.   IMPRESSIONS-RECOMMENDATIONS:  Mild obstructive sleep apnea/hypopnea  syndrome with a respiratory disturbance index of 10 events per hour and  O2 desaturation as low as 89%. Treatment for this degree of sleep apnea  may include weight loss alone if applicable, upper airway surgery, oral  appliance, and also CPAP.  Even though the patient's RDI is not overly  significant, the patient appears to have very fragmented sleep during  the last half of the night when his sleep disorder breathing worsened. I  recommend a trial of treatment to see if he has significant improvement.  Clinical  correlation is suggested.      Barbaraann Share, MD,FCCP  Diplomate, American Board of Sleep  Medicine    [facility]/MEDQ  D:  05/08/2006 16:58:23  T:  05/08/2006 16:10:96  Job:  [id number]

## 2010-09-20 NOTE — Assessment & Plan Note (Signed)
[facility]                           ELECTROPHYSIOLOGY OFFICE NOTE   NAME:[name], [name]                       MRN:          [id number]  DATE:11/26/2005                            DOB:          [birth year]    [name] returns today for follow-up.  He is a very pleasant elderly man  with a history of a complete heart block and severe fatigue and class III  heart failure who underwent upgrade to a BiV pacemaker approximately 10 days  ago.  He returns today for follow-up.  He had a problem with bleeding and a  hematoma postoperatively as he had been on Coumadin and it was felt that he  should not have his Coumadin stopped if at all possible secondary to  concerns of thromboembolic complications.  Bleeding has stopped and he  returns today for follow-up.  He states that he has felt fatigued and weak  for the last week.  He has also been dizzy.   PHYSICAL EXAMINATION:  GENERAL APPEARANCE:  He is a pleasant elderly man in  no acute distress.  VITAL SIGNS:  The blood pressure today was 126/77, pulse was 93 and  irregular, respirations were 18.  Weight was 152 pounds.  NECK:  No jugular venous distension.  LUNGS:  Clear to auscultation bilaterally.  CARDIOVASCULAR:  Irregular rhythm with normal S1 and S2.  CHEST:  The pacemaker pocket had a moderate to large hematoma but no active  bleeding was present there.  EXTREMITIES:  No edema.   Interrogation of his pacemaker demonstrates Medtronic [place] III.  He was in  atrial fibrillation.  His R-waves were 8 mV in the right ventricle, 13 in  the left and the pacing impedance 499 in the right ventricle and 614 in the  left.  Threshold was 0.75 at 0.4 in the right ventricle, 1 at 0.4 in the  left ventricle.  He had been mode switched for approximately one week.   IMPRESSION:  1.  Dizziness and fatigue, lightheadedness.  2.  Recurrent atrial fibrillation now present since his symptoms began.  3.  Status post  upgrade to a BiV pacemaker complicated by postoperative      hematoma.   DISCUSSION:  The issues are very difficult with [name].  He has clear  evidence of an at least moderate sized hematoma from his pacemaker upgrade.  He has atrial fibrillation.  He needs Coumadin but does not need it for  concerns of making the hematoma worse.  I recommended that he continue  Coumadin with hopes that we can keep the INR around 2.  Ultimately, we will have to proceed with DC cardioversion and prior to this,  up-titrate his amiodarone.  I will plan to see the patient back in the  office in several weeks.                                   [name] Canning. Ladona Ridgel, MD   GWT/MedQ  DD:  11/26/2005  DT:  11/26/2005  Job #:  [id number]   cc:   Tera Mater. Clent Ridges, MD

## 2010-09-20 NOTE — Op Note (Signed)
NAMEDERON, POOLE NO.:  [id number]   MEDICAL RECORD NO.:  [id number]          PATIENT TYPE:  OIB   LOCATION:  2899                         FACILITY:  [facility]   PHYSICIAN:  Bevelyn Buckles. Bensimhon, MDDATE OF BIRTH:  09-23-1918   DATE OF PROCEDURE:  01/07/2006  DATE OF DISCHARGE:                                 OPERATIVE REPORT   PATIENT IDENTIFICATION:  Mr. Stanzione is a delightful 77 year old male with a  history of paroxysmal atrial fibrillation as well as mild LV dysfunction. He  has undergone two previous cardioversions. He also recently underwent an  upgrade of his permanent pacemaker to a BiV pacemaker. However, he remains  symptomatic with significant fatigue and dyspnea on exertion. He was thus  referred for direct current cardioversion. His INR today was 2.5. In  reviewing his anticoagulation history, he has had therapeutic INRs dating  back to August 7.   SEDATION:  Per anesthesia was 125 mg of Sodium Pentothal.   CARDIOVERSION:  A single 150-joule biphasic shock was applied with  conversion to AV paced rhythm at 60. This was confirmed through intracardiac  electrogram using the programmer with the help of the Medtronic  representative. There were no apparent complications. The patient will  follow up with Dr. Ladona Ridgel.      Bevelyn Buckles. Bensimhon, MD  Electronically Signed     DRB/MEDQ  D:  01/07/2006  T:  01/07/2006  Job:  [id number]

## 2010-09-20 NOTE — Assessment & Plan Note (Signed)
[facility]                            CARDIOLOGY OFFICE NOTE   NAME:[name], [name]                       MRN:          [id number]  DATE:06/22/2006                            DOB:          1918-07-20    Mr. Zakarian comes in today for followup of generalized fatigue and  weakness and multiple complaints as outlined on previous notes.   Changing carvedilol 6.25 b.i.d. from __________ has really not helped.  He does have a positive sleep study for sleep apnea.  He is following  along with Dr. Craige Cotta.  Despite [place] asking him to take his Lasix every day,  he takes it just as needed.  He has significant swelling today and also  did not take it prior to coming to the office today.   His meds are unchanged.   PHYSICAL EXAMINATION:  VITAL SIGNS:  His blood pressure is 170/100.  His  pulse is 68 and regular.  His weight is 155 pounds.  HEENT:  Normocephalic atraumatic.  PERRLA.  Extraocular movements  intact.  Sclerae are clear.  LUNGS:  Reveal decreased breath sounds in both bases which is baseline.  HEART:  Reveals a normal S1 S2 with a systolic murmur with no gallop.  ABDOMEN:  Soft.  Good bowel sounds.  EXTREMITIES:  Reveal 2+ pitting edema all the way up to the tibia.  Pulses were diminished.  He has dependent rubor.  NEUROLOGIC:  Intact.  SKIN:  Shows some ecchymoses.   ASSESSMENT/PLAN:  I really have run out of things to do, unfortunately,  for Mr. Macpherson from a cardiovascular standpoint.   I have made no change in his program but I have reinforced the fact that  he needs to take his Lasix every single day.  This may help his blood  pressure as well.   Dr. Craige Cotta has agreed to schedule him for a CPAP study, which he has not  been able to complete.  Hopefully, this will help with some of his  fatigue and weakness.   I will plan on seeing him back in 3 months.     Thomas C. Daleen Squibb, MD, [facility]  Electronically Signed   TCW/MedQ  DD: 06/22/2006  DT:  06/22/2006  Job #: [id number]   cc:   Coralyn Helling, MD  Valetta Mole. Swords, MD

## 2010-09-20 NOTE — Discharge Summary (Signed)
NAMEZARYAN, YAKUBOV NO.:  [id number]   MEDICAL RECORD NO.:  [id number]          PATIENT TYPE:  INP   LOCATION:  3715                         FACILITY:  [facility]   PHYSICIAN:  Maple Mirza, P.A. DATE OF BIRTH:  July 25, 1918   DATE OF ADMISSION:  11/17/2005  DATE OF DISCHARGE:  11/18/2005                                 DISCHARGE SUMMARY   ALLERGIES:  THIS PATIENT HAS AN ALLERGY TO INDOMETHACIN.   PRINCIPAL DIAGNOSES:  1.  Discharging day #1 status post pacemaker upgrade to Medtronic Insync BiV      space pacer.  2.  History of complete heart block status post prior pacemaker implant.  3.  Congestive heart failure with ejection fraction of 40 to 45%.      1.  Doppler study shows dyssynchrony.      2.  Cardiopulmonary stress test demonstrates cardiac limitation through          activity.      3.  QRS on admission was 190 milliseconds, reduced to 144 milliseconds          post procedurally on July 16th.  4.  Post procedure ooze from skin bladder, resolved.   SECONDARY DIAGNOSES:  1.  History of paroxysmal atrial fibrillation on chronic Coumadin.  2.  Chronic amiodarone therapy for atrial fibrillation.  3.  Chronic lung disease.   PROCEDURE:  November 17, 2005 explantation of existing pacemaker with  implantation of Medtronic Insync CRT-P, Dr. Lewayne Bunting.   BRIEF HISTORY:  Mr. Polinski is an 87-yar-old male who has a history of  complete heart block.  He is status post pacemaker implantation.  He has a  severe fatigue, weakness secondary to congestive heart failure.  His  ejection fraction is 40 to 45%.   The patient does have a history of atrial fibrillation and he is maintained  on both Coumadin and amiodarone.  As a workup for his fatigue and weakness  which has existed for the last year, he had a tissue Doppler echocardiogram.  This demonstrated dyssynchrony.  He had a cardiopulmonary stress study which  demonstrated cardiac limitation to his exercise  capability.   His QRS is 190 milliseconds paced.  His pulmonary function evaluated by  pulmonologist shows fairly unremarkable pulmonary function tests.  There was  a question of pulmonary fibrosis based on activity.  This is not well  characterized.   The patient qualifies for CRT-P upgrade of his existing pacemaker.  Risks  and benefits have been described to the patient.  He wishes to proceed on  elective basis.   HOSPITAL COURSE:  The patient presented electively on July 16th to [facility].  He underwent explantation of his existing pacemaker with  implantation of Medtronic Insync pacemaker with left ventricular lead.  The  vein leading to the coronary sinus was tortious but with some patience the  left ventricular lead was placed and on interrogation post procedure day #1  seems to be functioning well.  QRS has been decreased from 190 milliseconds  to 144 milliseconds.  On the morning of post procedure  day #1, it was noted  that the patient had oozing from the lateral aspect of his incision  secondary to superficial skin bleeder.  Pressure was held over the whole  device although there was no significant hematoma noted.  It took some while  for the skin bleeder to resolve.  By the time of discharge, the patient has  been free of any recurrence for about an hour and a half.  The patient is  discharging July 17th with a low sodium, low cholesterol diet.  He is asked  not to drive for the next week.  He is not to lift anything heavier than 10  pounds for the next 4 weeks.  He is to keep his incision dry for the next 7  days.  He is to sponge bath until Monday, July 23rd.   DISCHARGE MEDICATIONS:  1.  Coumadin.  He is asked to hold off on taking Coumadin until Friday, July      23rd.  Restart his regular dose.  2.  Keflex, a new medication 250 mg one tablet 4 times daily for 5 days.  It      is recommended he take this medication 1/2 hour before breakfast, lunch      and  dinner.  Also he is on home medications including Lipitor 10 mg      daily at bedtime, amiodarone 200 mg daily, enteric coated aspirin 81 mg      daily, Digitek 0.125 mg daily, Zantac 75 mg daily and Toprol XL 100 mg      daily.  He has a followup with [facility].  [facility]      Thursday August 2nd at 9:40 and he will see Dr. Ladona Ridgel Monday, August      27th at 10:30.   LABORATORY DATA:  This admission is serum electrolytes; sodium 136,  potassium 4.2, chloride 98, carbonate 29, glucose 85, BUN is 15, creatinine  1.3, complete blood count white cells 6.8, hemoglobin 11.7, hematocrit 35.5  and platelets 182.      Maple Mirza, P.A.     GM/MEDQ  D:  11/18/2005  T:  11/18/2005  Job:  [id number]   cc:   Jeannett Senior A. Clent Ridges, M.D. [facility]  [address] [place]  [place] [address]   Doylene Canning. Ladona Ridgel, M.D.  [address]  [address]  [place]  [place] [address]

## 2010-09-20 NOTE — H&P (Signed)
[name], [name] NO.:  [id number]   MEDICAL RECORD NO.:  [id number]          PATIENT TYPE:  INP   LOCATION:  2007                         FACILITY:  [facility]   PHYSICIAN:  Learta Codding, M.D. LHCDATE OF BIRTH:  07-12-1918   DATE OF ADMISSION:  12/06/2004  DATE OF DISCHARGE:                                HISTORY & PHYSICAL   CHIEF COMPLAINT:  Dyspnea on exertion/atrial fibrillation with pauses.   PHYSICIANS:  Primary care physician: Tera Mater. Clent Ridges, M.D.  Primary cardiologist: Jesse Sans. Wall, M.D.   HISTORY OF PRESENT ILLNESS:  Mr. [name] is an 77 year old male with a  history of coronary artery disease and postoperative atrial fibrillation.  He was seen in the office on December 03, 2004, for increasing shortness of  breath as well as dyspnea on exertion and tachy palpitations.  The symptoms  started four to five weeks ago.  He was evaluated by Ward Givens, nurse  practitioner, in the office and had digoxin and Toprol XL 50 mg a day added  to his medication regimen and was put on a Holter monitor.   On the monitor, he was noted to have tachycardia-bradycardia syndrome and  heart rates occasionally up into the 140s.  He was also having pauses at one  point greater than 4 seconds and some bradycardia.  The longest pause was  4.1 seconds.  The strips were evaluated in the office, and Mr. [name] was  asked to come into the hospital for further evaluation and possible  pacemaker.   Mr. [name] stated that, after he had bypass surgery last October, he  recovered well and was feeling much better. About 4 to 5 weeks ago, he  noticed increasing fatigue as well as some orthostatic dizziness, occasional  blurred vision, and also some exertional epigastric discomfort.  These  symptoms are entirely new as he had no symptoms pre-bypass surgery.  He went  to his primary care physician for a routine checkup, and atrial fibrillation  was detected, so he was referred to Dr. Daleen Squibb  who got a stress test which was  abnormal, and a cardiac catheterization led to bypass surgery.   Mr. [name] has been compliant with the Toprol and digoxin that were added to  his medication regimen at the office visit.  He denies any increase in  dizziness over the last two days.  He is currently pain free and denies  shortness of breath or dizziness.   PAST MEDICAL HISTORY:  1.  He is status post aortocoronary bypass surgery x 4 in October 2005 with      postoperative atrial fibrillation.  2.  Hyperlipidemia.  3.  Family history of coronary artery disease.  4.  Chronic anticoagulation with Coumadin, INR 2.6 last week.  5.  History of peripheral vascular disease.  6.  History of gout.   PAST SURGICAL HISTORY:  1.  Cardiac catheterization.  2.  Bypass surgery.  3.  Right carotid endarterectomy greater than 20 years ago.   ALLERGIES:  No known drug allergies, although he states he had problems with  a GOUT  MEDICATION years ago, and he is not sure which one it was.   CURRENT MEDICATIONS:  1.  Toprol XL 50 mg daily.  2.  Digoxin 0.125 mg daily.  3.  Coumadin 5 mg daily.  4.  Lipitor 20 mg 1/2 tablet daily.  5.  Colchicine 0.6 mg daily.  6.  Ocu-Vite daily.   SOCIAL HISTORY:  He lives in [place] with his wife and is retired from  Therapist, sports work.  He quit tobacco in 1969 and does not abuse alcohol or  drugs.   FAMILY HISTORY:  His father died at age 6.  His mother died at age 96.  His  brother died in his 54s, all with a history of heart disease.   REVIEW OF SYSTEMS:  Significant for dyspnea on exertion, shortness of  breath, palpitations, presyncope as described above.  He has occasional  arthralgias.  He denies reflux symptoms.  He has had no recent illnesses,  fevers, or chills.  He denies hematemesis, hemoptysis, or melena.  Review of  Systems is otherwise negative.   PHYSICAL EXAMINATION:  VITAL SIGNS:  Temperature 97.9, blood pressure  119/95, heart rate 69,  respiratory rate 18, O2 saturation 98% on room air.  GENERAL:  He is a well-developed, elderly white male in no acute distress.  HEENT:  His head is normocephalic and atraumatic.  Pupils equal, round, and  reactive to light and accommodation.  Extraocular movements intact.  Sclerae  clear.  Nares without discharge.  NECK:  No lymphadenopathy, thyromegaly, and there is a well-healed CEA scar  on the right, but no carotid bruits are appreciated.  CARDIOVASCULAR:  His heart is irregular in rate and rhythm with an S1 and S2  but no significant murmur, rub, or gallop noted.  His distal pulses are 2+  in all four extremities, and no femoral bruits are appreciated.  LUNGS:  Clear to auscultation bilaterally.  SKIN:  No rashes or lesions are noted.  ABDOMEN:  Soft and nontender with active bowel sounds and no  hepatosplenomegaly by palpation.  EXTREMITIES:  There is no cyanosis, clubbing, or edema.  MUSCULOSKELETAL:  There is no joint deformity or effusion and no spinal or  CVA tenderness.  NEUROLOGIC:  He is alert and oriented.  Cranial nerves II-XII grossly  intact.   Chest x-ray, EKG, and labs are all pending.   ASSESSMENT AND PLAN:  1.  Atrial fibrillation with tachycardia-bradycardia syndrome:  He will be      admitted, and we will follow his rhythm on telemetry. Will decrease the      digoxin and continue the beta blocker if tolerated to prevent      tachycardia.  Electrophysiology will be asked to see him for possible      permanent pacemaker.  2.  Anticoagulation: Hold the Coumadin for possible pacemaker and follow his      INR.  3.  History of bypass surgery:  Cycle cardiac enzymes with recent epigastric      discomfort.  4.  He is otherwise stable.  See his Past Medical History.      Theodore Demark, P.A. [facility]      Learta Codding, M.D. [facility]  Electronically Signed    RB/MEDQ  D:  12/06/2004  T:  12/07/2004  Job:  [id number]   cc:   Jeannett Senior A. Clent Ridges, M.D. [facility] [address] [place]  [place] [address]   Jesse Sans. Wall, M.D.  [address]  [place]  [place] [address]

## 2010-09-20 NOTE — Assessment & Plan Note (Signed)
[facility]                           ELECTROPHYSIOLOGY OFFICE NOTE   NAME:Petroni, KRUZE ATCHLEY                       MRN:          [id number]  DATE:12/10/2005                            DOB:          10/09/1918    HISTORY OF PRESENT ILLNESS:  Mr. Silversmith returns today for follow up visit.  He is a very pleasant elderly man with a complete heart block, status post  pacemaker insertion with congestive heart failure and LV dysfunction.  He  underwent upgrade to a Bi-V pacemaker now almost a month ago.  This was  complicated by pocket hematoma which appears to be stabilized.  More  difficultly, the patient has had atrial fibrillation and has gone back in  atrial fibrillation since his pacemaker was upgraded.  For this reason, he  has been maintained back on Coumadin.  He notes that he continues to have  fatigue and weakness and decreased appetite, and overall failure to thrive.   PHYSICAL EXAMINATION:  GENERAL:  He is a pleasant, elderly-appearing man in  no acute distress.  VITAL SIGNS:  His blood pressure today was 136/70, pulse 65 and regular,  respirations were 18, the weight was 150 pounds.  NECK:  No jugular venous distention.  LUNGS:  Clear to auscultation bilaterally.  CARDIOVASCULAR:  Regular rate and rhythm with normal S1 and S2.  EXTREMITIES:  No edema.  CHEST:  The pacemaker insertion site still had a large hematoma with no  bleeding.  There is a large area of ecchymoses under the skin.   LABORATORY DATA:  EKG demonstrates underlying atrial fibrillation with  ventricular pacing.  His INR was not checked yet today, but will be done so.   IMPRESSION:  1.  Complete heart block.  2.  Congestive heart failure.  3.  Status post pacemaker insertion.  4.  Recent Bi-V pacemaker upgrade.  5.  Chronic Coumadin therapy.  6.  Atrial fibrillation.  7.  Failure to thrive.   DISCUSSION:  Today, we made several medication changes.  First, we will try  to  keep his INR between 2 and 2.5.  Secondly, we will continue him on his  Amiodarone.  Third, we will decrease his Toprol by half.  Next, we will stop  his Lanoxin.  Finally, we will stop his Lipitor.  I will plan to see the  patient back in the office in several weeks, hopefully feeling better at  that time.  If he can  keep his INR therapeutic for a couple of weeks, then we will plan to proceed  with DC cardioversion.                                   Doylene Canning. Ladona Ridgel, MD   GWT/MedQ  DD:  12/10/2005  DT:  12/10/2005  Job #:  [id number]   cc:   Jeannett Senior A. Clent Ridges, MD

## 2010-09-20 NOTE — Assessment & Plan Note (Signed)
[facility]                           ELECTROPHYSIOLOGY OFFICE NOTE   NAME:[name], [name]                       MRN:          [id number]  DATE:12/29/2005                            DOB:          1919/03/26    [name] returns today for followup.  He is a very pleasant elderly man  with a history of complete heart block who is status post pacemaker  insertion who developed severe fatigue and weakness, who also has a history  of persistent atrial fibrillation.  He underwent upgrade to a BiV pacemaker  several weeks ago with the procedure complicated by large hematoma.  In  addition to that he has been out of rhythm now for over a month and has been  uptitrated on his dose of amiodarone.  He returns today with followup.  The  patient states that today he feels better than he has in some time.  Otherwise, he had no specific complaints today.  His pacemaker pocket  swelling has improved nicely since his last visit.   PHYSICAL EXAMINATION:  On exam he is a pleasant, elderly man in no acute  distress.  VITAL SIGNS:  Blood pressure today was 157/88.  The pulse was 69 and  irregular.  Respirations were 18.  The weight was 152 pounds.  NECK:  No jugular venous distention.  LUNGS:  Clear bilaterally to auscultation.  CARDIOVASCULAR:  Irregular rhythm.  CHEST:  His pacemaker pocket was healing but still had a small amount of  hematoma there.  EXTREMITIES:  No edema.   The EKG today demonstrates atrial fibrillation with intermittent ventricular  pacing.  Review of his labs demonstrates therapeutic INRs.  Since August 7  his INR has been 3.4, 2.4 and 2.3.   IMPRESSION:  1. Complete heart block.  2. Dilated cardiomyopathy.  3. Persistent atrial fibrillation.  4. Status post pacemaker insertion with recent upgrade to a biventricular      pacemaker.   DISCUSSION:  [name] remains in AFIB on amiodarone.  My plan is to have  him come in this week for a  DC cardioversion.                                   Doylene Canning. Ladona Ridgel, MD   GWT/MedQ  DD:  12/29/2005  DT:  12/30/2005  Job #:  [id number]   cc:   [name] Senior A. Clent Ridges, MD

## 2010-09-20 NOTE — Discharge Summary (Signed)
[place]. [facility]  Patient:    [name], [name] Visit Number: [id number] MRN: [id number]          Service Type: SUR Location: 3300 3316 [id number] Attending Physician:  Caralee Ates Dictated by:   Tollie Pizza Collins, P.A.-C. Admit Date:  01/07/2001 Discharge Date: 01/08/2001   CC:         Jeannett Senior A. Clent Ridges, M.D.   Discharge Summary  PRIMARY ADMISSION DIAGNOSES: 1. Right carotid stenosis. 2. Questionable preoperative transient ischemic attack.  ADDITIONAL DIAGNOSES: 1. Hypertension. 2. History of prostate cancer. 3. Gastroesophageal reflux disease. 4. Osteoarthritis. 5. History of anemia.  PROCEDURES:  Right carotid endarterectomy with Dacron patch angioplasty.  HISTORY OF PRESENT ILLNESS:  The patient is an 77 year old male who was seen by his primary care physician for intermittent episodes of left hand and foot weakness, reporting several episodes occurring over the past two to three years.  He underwent a carotid ultrasound which showed stenosis in excess of 80% on the right.  He was seen by Dr. Elyn Peers, and it was felt that in light of his symptoms and his significant stenosis of the right internal carotid he should proceed with carotid endarterectomy at this time.  A carotid ultrasound was repeated in the [facility]TS office, and the findings were consistent with his previous ultrasound.  HOSPITAL COURSE:  He was admitted on January 07, 2001, and taken to the operating room, where he underwent a right carotid endarterectomy with Dacron patch angioplasty performed by Dr. Elyn Peers.  He tolerated the procedure well and was transferred to the floor in stable condition.  Postoperatively he has done well.  He has remained neurologically intact.  His incisions are healing well. He has remained afebrile, and vital signs are stable.  He is tolerating a regular diet, is having regular bowel and bladder function.  He is ambulating without difficulty.  It is felt he can be  discharged home at this time.  DISCHARGE MEDICATIONS: 1. Enteric-coated aspirin 81 mg daily. 2. Percocet 5/325 1-2 q.4h. p.r.n. for pain. 3. Ocuvite 1 daily. 4. Indapamide 1.25 mg daily. 5. Ferrous sulfate 325 mg daily. 6. Zantac 75 mg daily. 7. Calcium 500 mg daily. 8. Vitamin E 400 IU daily.  DISCHARGE INSTRUCTIONS:  He is asked to refrain from driving or lifting anything over 10 pounds for approximately one week.  He should follow a low-salt, low-fat diet.  He is asked to shower and clean his incisions daily with soap and water.  He should call our office if he experiences any redness, swelling, or drainage in the incision site, fever greater than 101, or any neurological symptoms.  FOLLOW-UP:  He will see Dr. Elyn Peers in the office in one month for follow-up.  He is asked to call if he has any problems in the interim. Dictated by:   Tollie Pizza Collins, P.A.-C. Attending Physician:  Caralee Ates. DD:  01/08/01 TD:  01/08/01 Job: [id number]

## 2010-09-20 NOTE — Op Note (Signed)
NAMEMUREL, SHENBERGER NO.:  [id number]   MEDICAL RECORD NO.:  [id number]          PATIENT TYPE:  OIB   LOCATION:  2899                         FACILITY:  [facility]   PHYSICIAN:  Doylene Canning. Ladona Ridgel, M.D.  DATE OF BIRTH:  [birth year]   DATE OF PROCEDURE:  05/30/2005  DATE OF DISCHARGE:  05/30/2005                                 OPERATIVE REPORT   PROCEDURE PERFORMED:  DC cardioversion.   INDICATIONS:  Symptomatic atrial fibrillation in the setting of persistent  atrial fibrillation and sinus bradycardia.   I. INTRODUCTION:  The patient is an 77 year old male with a history of  symptomatic bradycardia and atrial fibrillation status post pacemaker  insertion. He has developed persistent atrial fibrillation and has been on  amiodarone for over a month. He has felt poorly, and continued to have heart  failure symptoms in atrial fibrillation. He is now referred for DC  cardioversion.   II. PROCEDURE:  After informed consent was obtained the patient was placed  in the PACU and sedated under the direction of anesthesia. Then 200 joules  of synchronized biphasic energy was delivered through the anterior posterior  projection, electrode dispersive pads resulting in termination of atrial  fibrillation and restoration sinus rhythm. The patient tolerated the  procedure well.   III. COMPLICATIONS:  There were no immediate procedure complications.   IV. RESULTS:  This demonstrates successful DC cardioversion of atrial  fibrillation back to sinus rhythm in a patient with symptomatic atrial  fibrillation.           ______________________________  Doylene Canning. Ladona Ridgel, M.D.     GWT/MEDQ  D:  05/30/2005  T:  05/30/2005  Job:  [id number]   cc:   Jeannett Senior A. Clent Ridges, M.D. [facility]  [address] [place]  [place] [address]

## 2010-09-20 NOTE — H&P (Signed)
[place]. [facility]  Patient:    [name], [name] Visit Number: [id number] MRN: [id number]          Service Type: DSU Location: [facility] 2899 30 Attending Physician:  Cameron Proud Dictated by:   D. Karle Plumber, M.D. Admit Date:  05/14/2001                           History and Physical  PREOPERATIVE DIAGNOSIS:  Mediastinal adenopathy, left upper lobe; pleural plaques, bilateral.  PREOPERATIVE DIAGNOSIS:  Mediastinal adenopathy, left upper lobe; pleural plaques, bilateral.  OPERATION PERFORMED:  Fiberoptic bronchoscopy with mediastinoscopy.  INDICATIONS:  This patient was known to have bilateral pleural plaques on chest x-ray and CT, but also had some mild adenopathy that over follow-up CT increased in size.  He was brought to the operating room for fiberoptic bronchoscopy and mediastinoscopy.  DESCRIPTION:  After adequate general anesthesia the fiberoptic bronchoscope was passed through the endotracheal tube.  The right upper lobe, right middle lobe, and right lower lobe orifices were normal.  The left upper lobe and left lower lobe orifices were normal.  The carina was in the midline.  Washings and brushings were taken and cytologies were taken.  The fiberoptic bronchoscope was removed.  The anterior neck was prepped and draped in the usual sterile manner.  A transverse incision was made, was carried down with electrocautery through the subcutaneous tissue.  The pretracheal fascia was entered and the mediastinoscope was inserted.  Biopsies of a 4R and a 4L were done and sent for permanent section.  The strap muscles were closed with 2-0 Vicryl, subcutaneous tissue with 3-0 Vicryl, Steri-Strips were applied.  The patient was returned to the recovery room in stable condition.Dictated by:   D. Karle Plumber, M.D. Attending Physician:  Cameron Proud DD:  05/14/01 TD:  05/14/01 Job: [id number]

## 2010-09-20 NOTE — Assessment & Plan Note (Signed)
[facility]                             PULMONARY OFFICE NOTE   NAME:Catena, RAYGEN DAHM                       MRN:          [id number]  DATE:05/25/2006                            DOB:          1919/01/07    I met Mr. Tony today for evaluation of his obstructive sleep apnea. He  had undergone an overnight polysomnogram on April 17, 2006 and this  showed a respiratory disturbance index of 10 with an oxygen saturation  nadir of 89%. He had a significant REM effect with a REM apnea-hypopnea  REM RDI of 18.1 versus a non-REM RDI of 7.6. He also had quite a bit of  sleep fragmentation. He was initially referred  to have the sleep test  because he has been having complaints of lack of energy for the last 1  to 2 years. He will typically go to bed between 10 and 11:00 at night.  He falls asleep fairly quickly. He wakes up several times during the  night usually with coughing episodes as well as a gagging feeling. He  says he will also wake up feeling short of breath. He gets up between 7  and 8 in the morning. He says that he feels okay initially but than as  the day goes on he starts to feel tired and he will fall asleep when he  is idle such as watching TV. He denies any history of restless leg  syndrome. There is no history of sleep hallucinations, sleep paralysis,  cataplexy. He denies any history of bruxism. He also denies history of  sleep walking, sleep talking, nightmares, or night terrors. He is not  currently using anything to help him fall asleep or stay awake during  the day.   His past medical history, otherwise, is significant for coronary artery  disease status post coronary artery bypass grafting, hypertension,  atrial fibrillation, tachybrady syndrome, status post pacemaker  insertion, hiatal hernia, prostate cancer status post radiation therapy,  hyperlipidemia, anemia, asbestos related pleural plaques, emphysema,  obstructive sleep  apnea, and skin cancer. He had a carotid  endarterectomy as well as a history of gout.   His current medications are:  1. Prilosec 30 mg daily.  2. Carvedilol 6.25 mg b.i.d.  3. Amiodarone 200 mg daily.  4. Aspirin 81 mg daily.  5. Coumadin.  6. Ferrous sulfate 324 mg daily.  7. Ocuvite once daily.  8. Multivitamin daily.  9. Metamucil t.i.d.   He has no known drug allergies.   SOCIAL HISTORY:  He is married. He is retired from working in a [employer]. He stopped smoking in 1969, when he used to smoke cigars as  well as chew tobacco. There is no history of alcohol abuse.   FAMILY HISTORY:  Noncontributory.   REVIEW OF SYSTEMS:  Essentially negative except for stated above.   PHYSICAL EXAMINATION:  He is 5 feet 6 inches tall, 151 pounds.  Temperature is 97.9, blood pressure is 138/72, heart rate is 67, oxygen  saturation is 98% on room air.  HEENT:  Pupils reactive. There was no  sinus tenderness. He has a  Mallampati 3 airway. There is mild erosion over the anterior teeth.  There was no lymphadenopathy, no thyromegaly.  HEART:  S1, S2, regular rate and rhythm.  CHEST:  He has a kyphotic posture. He has prolonged expiratory phase,  but no wheezing or rales.  ABDOMEN:  Thin, soft, and tender.  EXTREMITIES:  He has 2+ lower extremity edema.  NEUROLOGICAL EXAM:  He is alert and oriented times three with 5/5  strength.   IMPRESSION:  Obstructive sleep apnea as demonstrated by an apnea-  hypopnea index of 10 and an oxygen saturation at night of 89%. I had  reviewed the sleep study results with Mr. Denz and his wife. I  discussed the possible associations with sleep apnea and hypertension,  coronary artery disease, cerebral vascular disease, diabetes, and  irregular heart beats. I had reviewed various  treatment options for his  sleep apnea, including continuous positive airway pressure therapy, oral  appliance and surgical intervention. Given the fact that he does  have  several underlying cardiovascular disease processes as well as  significant sleep disturbance and daytime fatigue, I do feel that it  would be warranted for him to undergo further therapy for his sleep  apnea to see if he can gain symptomatic benefit. Therefore, I have  referred him back to the sleep lab for a continuous positive airway  pressure titration study, after which I will initiate him on continuous  positive airway pressure therapy and follow up with him in approximately  6 to 8 weeks.     Coralyn Helling, MD  Electronically Signed    VS/MedQ  DD: 05/26/2006  DT: 05/26/2006  Job #: [id number]   cc:   Charlcie Cradle. Delford Field, MD, FCCP  Jesse Sans. Wall, MD, [facility]

## 2010-09-20 NOTE — Discharge Summary (Signed)
NAMETHATCHER, DOBERSTEIN NO.:  [id number]   MEDICAL RECORD NO.:  [id number]          PATIENT TYPE:  INP   LOCATION:  6736                         FACILITY:  [facility]   PHYSICIAN:  Valerie A. Felicity Coyer, MDDATE OF BIRTH:  17-Jun-1918   DATE OF ADMISSION:  10/16/2006  DATE OF DISCHARGE:  Nov 09, 2006                               DISCHARGE SUMMARY   DEATH SUMMARY   DIAGNOSES AT TIME OF DEATH:  1. Right lower lobe pneumonia.  2. Dehydration with hypotension.  3. Chronic right-sided heart failure.  4. Left-sided conjunctivitis.  5. Obstructive sleep apnea.  6. Chronic renal insufficiency.  7. Chronic atrial fibrillation with a supratherapeutic INR.  8. Mild hyperkalemia.  9. Chronic anemia.  10.History of coronary artery disease.  11.Gastroesophageal reflux disease.  12.Confusion.   HISTORY OF PRESENT ILLNESS:  Mr. Hosie was admitted on October 16, 2006  with a chief complaint of weakness. He has a history of right-sided  congestive heart failure and noted increased lower extremity swelling to  the point where his Lasix had recently been doubled for about 1-2 months  prior to this admission. He complained of increasing weakness to the  point where he could not even get out of bed. He was admitted for  further evaluation and treatment.   PAST MEDICAL HISTORY:  1. Congestive heart failure.  2. Atrial fibrillation.  3. Pacemaker.  4. Gout.  5. Severe tricuspid regurgitation.  6. Moderate aortic stenosis.  7. Gastroesophageal reflux disease.  8. History of obstructive sleep apnea on Prilosec.   HOSPITAL COURSE:  Right lower lobe pneumonia. The patient was admitted  and was treated with IV Rocephin and IV azithromycin which was later  changed to oral Avelox. He was noted to develop confusion during this  hospitalization which contributed to the question of underlying  dementia, an element of sundowning as well as hypoxia in the setting of  volume overload and question  of pneumonia. He was diuresed with Lasix  however he continued to decline clinically with hypoxia.  The patient  expired on 11-09-06.      Sandford Craze, NP      Raenette Rover. Felicity Coyer, MD  Electronically Signed    MO/MEDQ  D:  11/19/2006  T:  11/20/2006  Job:  [id number]

## 2010-09-20 NOTE — Assessment & Plan Note (Signed)
[facility]                           GASTROENTEROLOGY OFFICE NOTE   NAME:[name], [name]                       MRN:          [id number]  DATE:03/10/2006                            DOB:          [birth year]    [name] returns complaining of generalized weakness, shortness of breath,  dyspnea on exertion, and epigastric and lower sternal region pain that is  generally associated with shortness of breath.  He does not appear to have  any symptoms that are precipitated by meals or bowel movements.  He denies  gas, belching, heartburn, change in bowel habits, melena, or hematochezia.  Prior evaluation including upper endoscopy earlier this year was  unremarkable except for a hiatal hernia, GERD, and mild gastritis.  He feels  he is getting weaker all the time.  He states he has been told by Dr. Delford Field  and Dr. Ladona [name] that his weakness, shortness of breath, dyspnea on exertion,  lower chest pain, and epigastric pain are not pulmonary or cardiac.  His  last chest x-ray revealed stable cardiomegaly.  He had a CT angiogram of the  chest in April of this year, which revealed findings consistent with right  heart dysfunction.  Abdominal ultrasound earlier this year showed only a  left renal cyst, and a CT angiogram of the abdomen and pelvis did not reveal  any evidence for obstructive mesenteric disease.   CURRENT MEDICATIONS:  Listed on the chart, updated, and reviewed.   INDOMETHACIN side-effect leading to dizziness.   EXAM:  Elderly white male in no acute distress.  Weight 154, stable.  Blood pressure is 150/76, pulse 76 and regular.  CHEST:  Clear to auscultation bilaterally.  No chest wall tenderness  appreciated.  CARDIAC:  Regular rate and rhythm without murmurs appreciated.  ABDOMEN:  Soft and nontender with normoactive bowel sounds.   ASSESSMENT AND PLAN:  1. Chest pain and epigastric pain that are associated with shortness of      breath,  dyspnea on exertion, and weakness.  These symptoms are not of      gastrointestinal origin and appear to be more likely cardiac and/or      musculoskeletal.  Defer further evaluation to Drs. Gershon Crane and      Lewayne Bunting.  2. Gastroesophageal reflux disease with a hiatal hernia.  Although this      does not appear to be the cause of his main complaints, we will try      omeprazole 20 mg p.o. b.i.d. in place of Zantac and assess the      response.  Given his history of gastritis, we will obtain a      Helicobacter pylori antibody.  Ongoing followup with Dr. Clent [name] and Dr.      Ladona [name].  I will see back on referral.     [name] Lick. Russella Dar, MD, [facility]  Electronically Signed    MTS/MedQ  DD: 03/10/2006  DT: 03/10/2006  Job #: [id number]   cc:   [name] Senior A. Clent Ridges, MD  [name] Canning. Ladona Ridgel, MD

## 2010-09-20 NOTE — H&P (Signed)
[place]. [facility]  Patient:    [name], [name] Visit Number: [id number] MRN: [id number]          Service Type: Attending:  Caralee Ates, M.D. Dictated by:   Durenda Age, P.A.-C. Adm. Date:  01/07/01   CC:         Jeannett Senior A. Clent Ridges, M.D.   History and Physical  DATE OF BIRTH:  [birth year]  CHIEF COMPLAINT:  Right carotid artery disease.  HISTORY OF PRESENT ILLNESS:  An 77 year old white male referred by Dr. Clent Ridges for evaluation of carotid artery disease.  The patient has been experiencing what appears to be left-sided temporary hemiparesis resolving within 10-15 minutes for the last 2-3 years.  His most recent episode was about two months ago.  On July 22, carotid ultrasound revealed severe right ICA stenosis with repeat Dopplers on December 11, 2000 with no evidence of complex plaque morphology. Dr. Elyn Peers, upon evaluation, recommended to proceed with right CEA, which is scheduled for January 07, 2001.  ______ the symptoms described above, the patient denies any headache, nausea, vomiting, vertigo, dizziness, falls, seizures, numbness, tingling, speech impairment, dysphagia, vision changes, syncope, presyncope, memory loss, or confusion.  PAST MEDICAL HISTORY:  Carotid artery disease, history of prostate cancer requiring XRT in November 2000 (38 treatments), hypertension, GERD, arthritis.  PAST SURGICAL HISTORY:  Status post tonsillectomy as a child, status post prostate resection.  MEDICATIONS: 1. Indapamide 1.25 mg q.d. 2. Aspirin 325 mg q.d. 3. Calcium 500 mg q.d. 4. Ocuvite q.d. 5. Ferrous sulfate 325 mg q.d. 6. Zantac 75 mg q.d. 7. Garlic and parsley q.d. 8. Vitamin E 400 IU q.d. 9. ______ 2 tbsp q.d.  ALLERGIES:  No known drug allergies.  REVIEW OF SYSTEMS:  See HPI and past medical history for significant positives.  No diabetes, kidney disease, or asthma.  FAMILY HISTORY:  Mother died at 74 of heart disease and CVA.  Father died  at 20 of heart disease.  Sister died of heart disease.  One brother died of lung disease at 27.  SOCIAL HISTORY:  The patient has been married for 65 years.  He has three grown children.  He is retired.  He denies any tobacco intake since 1969.  He denies any alcohol intake.  PHYSICAL EXAMINATION:  GENERAL:  A well-developed, well-nourished 77 year old white male in no acute distress, alert and oriented x 3, who looks much younger than his stated age.  VITAL SIGNS:  Blood pressure 120/60, pulse 64, respirations 18.  HEENT:  Normocephalic, atraumatic.  PERRLA.  EOMI.  Bilateral cataracts.  No glaucoma or macular degeneration.  NECK:  Supple.  No JVD.  Audible bruits bilaterally, more pronounced on the left.  No lymphadenopathy.  CHEST:  Symmetrical on inspiration.  Lungs clear to auscultation.  CARDIOVASCULAR:  Regular rate and rhythm with a 2+ harsh systolic murmur.  No rubs or gallops.  ABDOMEN:  Soft, nontender.  Bowel sounds x 4.  No masses or bruits.  GENITOURINARY/RECTAL:  Deferred.  EXTREMITIES:  No clubbing or cyanosis.  Trace edema bilaterally.  No ulcerations.  There are arthritic changes in his hands.  Temperature warm. Peripheral pulses:  Carotids 2+ bilaterally.  Femoral, popliteal, dorsalis pedis, and posterior tibialis 2+ bilaterally.  NEUROLOGIC:  Nonfocal.  Gait steady.  DTRs 2+ bilaterally.  Muscle strength 5/5.  ASSESSMENT AND PLAN:  Right internal carotid artery stenosis for right carotid endarterectomy on January 07, 2001.  Dr. Elyn Peers has seen and evaluated this patient prior to  this admission and has explained the risks and benefits involved in the procedure and the patient has agreed to continue. Dictated by:   Durenda Age, P.A.-C. Attending:  Caralee Ates, M.D. DD:  01/08/01 TD:  01/08/01 Job: [id number]

## 2010-09-20 NOTE — Op Note (Signed)
[name], [name] NO.:  [id number]   MEDICAL RECORD NO.:  [id number]          PATIENT TYPE:  INP   LOCATION:  2308                         FACILITY:  [facility]   PHYSICIAN:  Evelene Croon, M.D.     DATE OF BIRTH:  13-Aug-1918   DATE OF PROCEDURE:  02/05/2004  DATE OF DISCHARGE:                                 OPERATIVE REPORT   PREOPERATIVE DIAGNOSIS:  Severe left main and three-vessel coronary artery  disease.   POSTOPERATIVE DIAGNOSIS:  Severe left main and three-vessel coronary artery  disease.   OPERATIVE PROCEDURE:  Median sternotomy, extracorporeal circulation,  coronary artery bypass graft surgery x4 using a left internal mammary artery  graft to the left anterior descending coronary artery, with a saphenous vein  graft to the diagonal branch of the left anterior descending, a saphenous  vein graft to the obtuse marginal branch of the left circumflex coronary  artery, and a saphenous vein graft to the posterolateral branch of the right  coronary artery.  Endoscopic vein harvesting from the right leg.   ATTENDING SURGEON:  Evelene Croon, M.D.   ASSISTANT:  Coral Ceo, P.A.   ANESTHESIA:  General endotracheal.   CLINICAL HISTORY:  This patient is an 77 year old gentleman with a history  of cerebrovascular disease, status post right carotid endarterectomy in  2002.  He was found to be in atrial fibrillation recently and a stress  Cardiolite exam was performed which showed a small inferior wall infarction  with moderate peri-infarction ischemia.  His ejection fraction was 63%.  He  subsequently underwent cardiac catheterization on December 22, 2003 by Dr.  Charlies Constable, which showed a 70% to 80% left main stenosis with  calcification from the ostium to the bifurcation.  The LAD was heavily  calcified and had 80% proximal stenosis.  The left circumflex was a heavily  calcified vessel.  There was a small marginal branch and then a larger  marginal or  posterolateral branch.  The posterolateral branch had a 70%  ostial stenosis and an 80% proximal stenosis.  The right coronary artery was  a dominant vessel that gave off a diffusely diseased posterior descending  branch.  This had 70% proximal and 95% mid-to-distal stenosis.  Then there  was about 70% stenosis in the distal right coronary artery before the  posterolateral branch.  The posterolateral was a larger graftable vessel.  Left ventricular ejection fraction was about 60%.  The aortic root was  mildly dilated and tortuous.  There was trace aortic insufficiency, but no  gradient across the aortic valve or on pullback.  After review of the  angiogram and examination of the patient, it was felt that coronary artery  bypass graft surgery was the best treatment to prevent further ischemia and  infarction, and sudden death.  I discussed the operative procedure with the  patient and his wife including alternatives, benefits and risks including  bleeding, blood transfusion, infection, stroke, myocardial infarction, graft  failure, and death.  They understood and agreed to proceed.   OPERATIVE PROCEDURE:  The patient was taken to the operating room  and placed  on the table in supine position.  After induction of general endotracheal  anesthesia, a Foley catheter was placed in the bladder using sterile  technique.  Then the chest, abdomen and both lower extremities were prepped  and draped in the usual sterile manner.  The chest was entered through a  median sternotomy incision and the pericardium opened in the midline.  Examination of the heart showed good ventricular contractility.  The  ascending aorta had no palpable plaques in it.   Then the left internal mammary artery was harvested from the chest wall as a  pedicle graft.  This was a medium-caliber vessel with excellent blood flow  through it.  At the same time, a segment of greater saphenous vein was  harvested from the right leg  using endoscopic vein harvest technique.  This  vein had somewhat thickened wall, but was still of good quality and medium  size.   Then the patient was heparinized and when an adequate activated clotting  time was achieved, the distal ascending aorta was cannulated using a 20-  [place] aortic cannula for arterial inflow.  Venous outflow was achieved  using a 2-stage venous cannula through the right atrial appendage.  An  antegrade cardioplegia and vent cannula was inserted in the aortic root.  The patient was placed on cardiopulmonary bypass and the distal coronary was  identified.  The LAD was diffusely diseased with calcific plaque and this  extended out into the distal portion of the vessel.  The vessel was  intramyocardial and fairly deep in its proximal and midportions and exited  through the surface of the heart at the junction of the middle and distal  third.  There was one area here that was soft enough to graft.  The diagonal  branch was visible proximally where it was also diseased with some calcific  plaque.  This vessel was very tortuous and became intramyocardial distally.  The first marginal branch was a small non-graftable vessel.  The left  circumflex terminated as a larger marginal or posterolateral branch, which  was suitable for grafting with mild distal disease in it.  The right  coronary artery was diffusely diseased.  The posterior descending had  extensive calcific plaque extending out into the distal portion of the  vessel.  This vessel was not graftable.  The posterolateral branch was a  larger vessel that had minimal distal disease and it was suitable for  grafting.   Then the aorta was cross-clamped and 500 mL of cold blood antegrade  cardioplegia was administered in the aortic root for quick arrest of the  heart.  Systemic hypothermia to 23 centigrade and topical hypothermia with iced saline were used.  A temperature probe was placed in the septum and an   insulating pad in the pericardium.   The first distal anastomosis was performed to the posterolateral branch of  the right coronary artery.  The internal diameter was about 1.75 mm.  The  conduit that was used was a segment of the greater saphenous vein and the  anastomosis performed in an end-to-side manner using continuous 7-0 Prolene  suture.  Flow was noted through the graft and was excellent.   The second distal anastomosis was performed to the obtuse marginal branch.  The internal diameter was about 1.6 mm.  The conduit that was used was a  second segment of greater saphenous vein and the anastomosis was performed  in an end-to-side manner using continuous 7-0 Prolene suture.  Flow was  noted through the graft and was excellent.   The third distal anastomosis was performed to the diagonal branch.  The  internal diameter of this vessel was about 1.5 mm.  The conduit that was  used was a third segment of greater saphenous vein and the anastomosis  performed in an end-to-side manner using continuous 7-0 Prolene suture.  Flow was measured through the graft and was excellent.  Then another dose of  cardioplegia was given down the vein grafts and in the aortic root.   The fourth distal anastomosis was performed to the midportion of the left  anterior descending coronary artery just as it exited the muscle.  The  internal diameter here was about 1.6 mm.  The conduit that was used was a  left internal mammary graft and this was brought through an opening in the  left pericardium anterior to the phrenic nerve.  It was anastomosed to the  LAD in an end-to-side manner using continuous 8-0 Prolene suture.  The  pedicle was tacked to the epicardium with 6-0 Prolene sutures.  Then the  proximal vein graft anastomoses were performed in the aortic root in an end-  to-side manner using continuous 6-0 Prolene suture.  The patient was  rewarmed to 37 degrees centigrade.  The clamp was removed from  the mammary  pedicle.  There was rapid warming of the ventricular septum and return of  spontaneous ventricular fibrillation.  The cross-clamp was removed with a  time of 89 minutes and the patient spontaneously converted to sinus rhythm.   The proximal and distal anastomoses appeared hemostatic and the line of the  graft satisfactory.  Graft markers were placed around the proximal  anastomoses.  Two temporary right ventricular and right atrial pacing wires  were placed and brought out through the skin.   When the patient had rewarmed to 37 degrees centigrade, he was weaned from  cardiopulmonary bypass on no inotropic agents.  Total bypass time was 113  minutes.  Cardiac function appeared excellent and the cardiac output was 7 L  a minute.  Protamine was given and the venous and aortic cannulae were  removed without difficulty.  Hemostasis was achieved.  Three chest tube were placed with a tube in the posterior pericardium, 1 in the left pleural space  and 1 in the anterior mediastinum.  The pericardium was reapproximated over  the heart.  The sternum was closed with #6 stainless steel wires.  The  fascia was closed with continuous #1 Vicryl suture.  Subcutaneous tissue was  closed with a continuous 2-0 Vicryl and the skin with 3-0 Vicryl  subcuticular closure.  The lower extremity vein harvest sites were closed in  layers in a similar manner.  The sponge, needle and instrument counts were  correct according to the scrub nurse.  Dry sterile dressings were applied  over the incisions and around the chest tube which were hooked to Pleur-evac  suction.  The patient remained hemodynamically stable and was transported to  the SICU in guarded but stable condition.      Darden Amber   BB/MEDQ  D:  02/05/2004  T:  02/05/2004  Job:  [id number]   cc:   [facility]   [place] [facility]   [facility]iac [facility]

## 2010-09-20 NOTE — Assessment & Plan Note (Signed)
[facility] OFFICE NOTE   NAME:[name], [name]                       MRN:          [id number]  DATE:08/31/2006                            DOB:          08/27/1918    This is an 77 year old gentleman accompanied by his wife for a complete  physical examination. Once again, the main complaint from both of them  is his profound fatigue and weakness. We discussed this at my last visit  with him on April 17th. At that time, I felt that there were multiple  etiologies for his weakness going on as opposed to one single  undiagnosed problem. At that time, he also had extreme edema to his  lower extremities. We doubled his Lasix at that point. He thinks it has  helped the swelling in his legs somewhat but has not helped his overall  fatigue and shortness of breath unfortunately. He has mentioned this  nonspecific fatigue to all of his medical specialists over the past  year, and in reviewing notes from most of them, they are as perplexed as  I am as to how to explain it. He is quite frustrated today because he  says it has rapidly progressed over the past month or two to now he is  unable to do almost any activities of daily living without assistance  from his wife. He denies chest pain per se, but says he gets short of  breath with minimal exertion. His appetite is poor. He denies any  particular pain syndromes.   For details of his past medical history, family history, social history,  habits, etc... refer to our last physical note dated October 29, 2004.   Of note, he continues to see Dr. Daleen Squibb for history of atrial fibrillation  as well as well as tachybrady syndrome. He has had a pacemaker placed  which apparently is working as intended. He last saw Dr. Daleen Squibb in  February of this year. He sees Dr. Russella Dar for GI care. He last saw Dr.  Russella Dar in November of 2007. His last colonoscopy was in July of 2001.  However, Dr. Russella Dar  did a workup for abdominal discomfort and fatigue  with multiple CT scans in April of 2007. This included CT with  angiography of the abdomen and pelvis. This was essentially unrevealing.  He also had a CT scan of the chest at the time that showed some evidence  of emphysema and some chronic bibasilar atelectasis only. Workup at that  time also included upper endoscopy as well as abdominal ultrasound which  again was fairly unrevealing. He does complain of some constipation  problems lately. Dr. Russella Dar had suggested Metamucil, but his wife wonders  if there is something else that he could try. He has seen no color  changes in his stool and no blood in his stool recently. He sees Dr.  Coralyn Helling on a regular basis for pulmonary care including COPD and  sleep apnea. He last saw Dr. Craige Cotta in February of this year. He had also  seen Dr. Etta Grandchild in the past for urologic  care. He last saw Dr. Etta Grandchild in  August of 2007, apparently he is now due to be seen later this summer by  Dr. Aldean Ast since Dr. Etta Grandchild has retired.   ALLERGIES:  None.   CURRENT MEDICATIONS:  1. Coumadin as directed daily.  2. Multivitamins daily.  3. Aspirin 81 mg daily.  4. Lasix 40 mg b.i.d.  5. Klor-Con 20 mEq b.i.d.  6. Metamucil b.i.d.  7. Amiodarone 200 mg per day.  8. Prilosec over-the-counter daily.  9. Carvedilol 6.25 mg b.i.d.  10.Docusate sodium as needed.  11.Astelin nasal spray twice daily.  12.Ferrous sulfate 325 mg b.i.d.   OBJECTIVE:  Height 5 feet, 4 inches. Weight 153 (which is down 4 pounds  since our visit last week). Blood pressure 128/82. Pulse 72 and regular.  Temperature 97.8 degrees.  In general, he is elderly and frail. There is a pronounced kyphotic  curve to his thoracic spine.  SKIN: Is clear except for some mild candida involvement between the  buttocks.  EYES: Are clear.  EARS: Are clear.  OROPHARYNX: Is clear.  NECK: Supple, without lymphadenopathy or thyromegaly.  LUNGS:  Clear,  except for some fine bibasilar crackles.  CARDIAC: Rate is regular. Rhythm is regular without gallops or rubs. He  has a 3/6 systolic murmur loudest at the base and also a 2/6 systolic  murmur loudest at the apex. He has 4+ pitting edema to both lower  extremities from the knees down. This does appear to be slightly  improved from our visit a week ago.  ABDOMEN: Soft. Normal bowel sounds, nontender and no masses.  GENITALIA: Normal male. He is not circumcised.  RECTAL: No masses or tenderness. Prostate is within normal limits. Stool  Hemoccult negative.  NEUROLOGIC: Grossly intact.   Again, we reviewed some laboratories we obtained on April 17, including  a CBC, basic metabolic panel, hepatic panel, TSH, B12, folate and iron.  This is remarkable only for a low hemoglobin at 10.8. He also had a  fasting lipid panel performed on April 23. This showed an excellent HDL  at 49 with a borderline LDL of 100. We did not check a PSA since he is  seen on a regular basis by Urology.   ASSESSMENT/PLAN:  1. Complete physical examination, obviously I would like him to be a      little more active, but with his profound fatigue, exercise is out      of the question. I encouraged him to keep up good nutritional      intake.  2. Weakness: Probable from multiple etiologies. Will see if Hematology      can shed any more light on the subject as noted below.  3. Anemia: Will set him up soon to see Hematology for further      evaluation and treatment.  4. Chronic obstructive pulmonary disease and sleep apnea: He will      followup with Dr. Craige Cotta.  5. Atrial fibrillation and tachybrady syndrome: He will followup with      Dr. Daleen Squibb.  6. Hyperlipidemia, stable.  7. Hypertension, stable.  8. Lower extremity edema: Will continue Lasix as above.     Tera Mater. Clent Ridges, MD  Electronically Signed    SAF/MedQ  DD: 08/31/2006  DT: 08/31/2006  Job #: [id number]

## 2010-09-20 NOTE — Op Note (Signed)
[name], PANGBORN NO.:  [id number]   MEDICAL RECORD NO.:  [id number]          PATIENT TYPE:  INP   LOCATION:  2007                         FACILITY:  [facility]   PHYSICIAN:  Doylene Canning. Ladona Ridgel, M.D.  DATE OF BIRTH:  Jun 14, 1918   DATE OF PROCEDURE:  DATE OF DISCHARGE:                                 OPERATIVE REPORT   PROCEDURE PERFORMED:  Implantation of dual-chamber pacemaker.   INDICATIONS:  Symptomatic bradycardia with pauses of up to 5 seconds in  setting of persistent atrial fibrillation.   INTRODUCTION:  The patient is a very pleasant 86-year male with coronary  disease, preserved LV function, status post bypass surgery, postoperative  atrial fibrillation which has been persistent, and bradycardia. The patient  was found to have near syncope and pauses of nearly  5 seconds is now  referred for permanent pacemaker insertion.   PROCEDURE:  After informed consent was obtained, the patient taken to  diagnostic EP lab in fasting state.  After usual preparation and draping,  intravenous fentanyl and midazolam was given for sedation, 30 mL  of  lidocaine was infiltrated in left infraclavicular region.  A 5 cm incision  was carried out over this region, electrocautery utilized to dissect down  the fascial plane, 10 mL of contrast was injected into the left upper venous  system, demonstrated patent left subclavian vein, subsequently punctured x2,  and a Medtronic model 5076, 52-cm active fixation pacing lead serial number  [id number]  [id number] was then advanced to the right ventricle and a Medtronic model  5076 45-cm active fixation pacing lead serial number [id number] was  advanced to the right atrium. Mapping was carried out in the right ventricle  a the final site near the RV apical septum. The R-waves measured 8 mV and  the pacing threshold 0.9 volts of 0.5 milliseconds. The pacing impedance was  835 ohms, lead actively fixed. 10 volt pacing did not stimulate the  diaphragm. With ventricular lead in satisfactory position, attention was  then turned the atrial lead.  It was placed in the right atrial appendage  where fibrillation waves measured 3 mV. The pace impedance was 567 ohms. AOO  pacing was carried out with pacing impedance as noted. With both atrial and  ventricular leads in satisfactory position, they were secured at  subpectoralis fascia with a figure-of-eight silk suture. In addition, the  sewing sleeve was secured with silk suture. Electrocautery was utilized to  make a subcutaneous pocket. Kanamycin irrigation was utilized to irrigate  the pocket. Electrocautery was utilized to assure hemostasis.   The Medtronic Enpulse E2 DR 01, serial number [id number] dual-chamber  pacemaker was connected to the atrial and ventricular pacing leads and  placed in the subcutaneous pocket. Generator was secured with silk suture.  The incision was closed with a layer of 2-0 Vicryl followed by layer of 3-0  Vicryl, followed by layer of 4-0  Vicryl.  Indermil was painted on the skin,  Steri-Strips were applied, and a pressure dressing was placed and the  patient was returned to his room in satisfactory  condition.  There were  immediate complications.   RESULTS:  This demonstrates successful implantation of a Medtronic dual-  chamber pacemaker in a patient with symptomatic bradycardia, persistent  atrial fibrillation, and coronary disease. Consideration will be made in the  future once Coumadin has been reinitiated to start amiodarone and try to  return the patient back to sinus rhythm to improve his symptoms.       GWT/MEDQ  D:  12/09/2004  T:  12/09/2004  Job:  [id number]   cc:   Thomas C. Wall, M.D.

## 2010-09-20 NOTE — Consult Note (Signed)
NAMEBURNIS, HALLING NO.:  [id number]   MEDICAL RECORD NO.:  [id number]          PATIENT TYPE:  INP   LOCATION:  2007                         FACILITY:  [facility]   PHYSICIAN:  Doylene Canning. Ladona Ridgel, M.D.  DATE OF BIRTH:  18-Mar-1919   DATE OF CONSULTATION:  12/07/2004  DATE OF DISCHARGE:                                   CONSULTATION   REFERRING PHYSICIAN:  Jesse Sans. Daleen Squibb, M.D.; Charlton Haws, M.D.   INDICATION FOR CONSULTATION:  Evaluation of severe bradycardia in  conjunction with dyspnea.   HISTORY OF PRESENT ILLNESS:  The patient is a very pleasant elderly male  with a history of coronary artery disease, status post bypass surgery in  October 2005.  The patient developed postoperative atrial fibrillation at  that time. It is unclear whether he had atrial fibrillation prior to his  bypass surgery. The patient was initially bradycardic and could not tolerate  any AV nodal blocking drugs. He did fairly well until approximately one  month ago when he noted increasing dyspnea, rare palpitations, and fatigue.  He has had no frank syncope, but has had dizzy spells. He occasionally  developed blurred vision which lasted for 60 seconds. A 48-hour monitor was  placed and this demonstrated atrial fibrillation with pauses over four  seconds. He is now referred for consideration of permanent pacemaker  implantation.   PAST MEDICAL HISTORY:  As previously noted.  In addition, he has a history  of dyslipidemia, history of peripheral vascular disease, history of gout.   SOCIAL HISTORY:  The patient lives in [place] and is married. He quit  smoking cigarettes in 1969. He denies ethanol abuse.   FAMILY HISTORY:  Notable for mother dying at age 63 of coronary artery  disease and his father dying at age 68 of coronary artery disease.   REVIEW OF SYSTEMS:  Negative for fevers, chills, or nightsweats. There is no  adenopathy. He denies vision or hearing problems, except as  previously noted  with brief episodes of decreased vision. He denies any headache or  photophobia. He denies skin rashes or lesions. He denies chest pain, but  does have dyspnea with exertion. He denies PND or orthopnea. He has  palpitations and has had presyncope. He denies claudication. He denies  dysuria, hematuria, or nocturia. He denies weakness, numbness, depression,  or anxiety. He denies any joint deformities, but does have arthralgias. He  denies nausea, vomiting, diarrhea, constipation, polyuria, polydipsia, or  heat or cold intolerance. The rest of his review of systems is negative,  except as previously noted above.   PHYSICAL EXAMINATION:  GENERAL: He is a pleasant, well appearing 77 year old  male in no distress.  VITAL SIGNS: Blood pressure 120/90, pulse 70 and irregularly irregular,  respirations 18, temperature 98.  HEENT: Normocephalic and atraumatic. Pupils are equal and round. The  oropharynx is moist. Sclerae anicteric.  NECK: No jugular venous distention. There is no thyromegaly. The trachea is  midline.  LUNGS: Clear bilaterally to auscultation with rales, rhonchi, or wheezes.  CARDIOVASCULAR: An irregularly irregular rhythm with normal S1 and S2.  ABDOMEN: Soft, nontender, nondistended. No organomegaly.  EXTREMITIES: No clubbing, cyanosis, or edema. The pulses are 2+ and  symmetric.  NEUROLOGIC: Alert and oriented times three with cranial nerves intact.  Strength 5/5 and symmetric.   EKG demonstrates atrial fibrillation with a controlled ventricular response.  Review of his 48-hour monitor demonstrates atrial fibrillation with pauses  over four seconds as well as atrial fibrillation with rapid ventricular  response and heart rates of over 145 beats per minute.   IMPRESSION:  1.  Symptomatic bradycardia.  2.  Persistent atrial fibrillation.  3.  Coronary artery disease, status post bypass surgery.   DISCUSSION:  I discussed the risks, benefits, goals, and  expectations of  permanent pacemaker implantation with the patient. He wishes to proceed with  this therapy. It is unknown at this point as to whether attempts should be  made to return the patient back to sinus rhythm with anti-arrhythmic drug  therapy once the pacemaker has been in place. Will base this partially on  how the patient feels once his pacemaker has been implanted. He might  possibly feel better on a regimen of amiodarone and sinus rhythm with atrial  pacing.       GWT/MEDQ  D:  12/07/2004  T:  12/08/2004  Job:  [id number]   cc:   Charlton Haws, M.D.   Tera Mater. Clent Ridges, M.D. [facility]

## 2010-09-20 NOTE — Assessment & Plan Note (Signed)
[facility]CARE                            CARDIOLOGY OFFICE NOTE   NAME:[name], [name] VELAQUEZ                       MRN:          [id number]  DATE:05/01/2006                            DOB:          04/08/1919    [name] comes in today for a followup.   He still has significant fatigue and has multiple complaints today.  This varies from everything from his legs, aching, not sleeping well,  lower extremity swelling, and shortness of breath.  His biggest  complaint is fatigue however.   He has recently seen [name] on February 25, 2006.  His pacer  has been upgraded to a BiV.  He was cardioverted out of atrial  fibrillation into sinus rhythm, maintaining a sinus rhythm on  amiodarone.  He is in sinus rhythm today.  He has also seen [name], who diagnosed him with gastroesophageal reflux.  His hemoglobin  was 11.6 in October.  His albumin was low at 2.9.  I suspect his  appetite is not good which he confirms today.   A friend has convinced him that the Coumadin is making him tired.  He  would like to stop it.   His medications today are:  1. Prilosec over-the-counter 20 mg daily.  2. Propranolol 80 mg daily.  3. Amiodarone at 200 mg daily.  4. Coumadin.  5. Aspirin 81 mg daily.  6. Multivitamin daily.  7. Metamucil 2 b.i.d.  8. Iron sulfate 324 daily.   EXAM:  He is elderly, very thin, and frail-appearing.  His weight is actually up a couple of pounds to 156.  His blood pressure  is 154/85.  Rechecked it with a manual cuff in the left arm 150/80.  His  pulse is 60 and he is in sinus rhythm with ventricular pacing __________  AV paced.  HEENT:  Normocephalic, atraumatic. Skin is a darkish color, which is  baseline.  PERRLA.  Extraocular movements intact.  Sclerae are clear.  He is nonicteric.  Facial symmetry is normal.  Carotid upstrokes are equal bilaterally without bruits.  There is no  JVD.  Thyroid is not enlarged.  LUNGS:   Clear to auscultation.  CHEST:  A very prominent pacer because of his body habitus.  It is  nontender.  There is no evidence of residual hematoma.  PMI is nondisplaced.  He has a normal S1, S2 without a S3 gallop.  ABDOMEN:  Soft with good bowel sounds.  No midline bruits.  No  hepatomegaly.  EXTREMITIES:  1+ pitting edema on the right, 2+ on the left.  His vein  grafts were taken from the right.  He has good pulses.  NEURO:  Intact, other than him having a depressed affect.  SKIN:  A few ecchymosis.   ASSESSMENT:  I have had a long talk with [name] and his wife today.  This man has been worse ever since he had his cardiovascular surgery.  I  am really running out of things to do as I think others are.   PLAN:  1. Change propranolol to  Carvedilol 6.25 b.i.d. in hopes of improving      his cardiac output and lowering his blood pressure.  I told him      that after 4-6 weeks, patients tend to feel better on this drug.  2. Stop Coumadin.  We will increase his aspirin to 325 daily.  He      realizes there is an increased risk of stroke.  I will see him back      in 2 weeks and we will see if that has made him better.  3. Renew his p.r.n. furosemide and potassium.     [name] C. Daleen Squibb, [name], [facility]  Electronically [name]    TCW/MedQ  DD: 05/01/2006  DT: 05/01/2006  Job #: [id number]   cc:   [name] Senior A. Clent Ridges, [name]  [name] Canning. Ladona Ridgel, [name]  [name] Lick. Russella Dar, [name], [name] Graham  Charlcie [name]. Delford Field, [name], FCCP

## 2010-09-20 NOTE — Assessment & Plan Note (Signed)
 HEALTHCARE                             PULMONARY OFFICE NOTE   NAME:Skidmore, RIELY BASKETT                       MRN:          [id number]  DATE:07/20/2006                            DOB:          1918/10/10    I saw Mr. Sabine with his wife today for followup of his sleep apnea.   He is currently on CPAP at 9 cm of water, he uses a full face mask and  heated humidification.   He had some difficulty initially tolerating this, but says that over the  last few days it seems to have gotten better and that his use of the  machine last night was the best that he has had so far.  He says he has  to sleep in a recliner because he has a significant amount of nasal  drainage.  He has tried using Benadryl over the counter which seems to  help.  He does feel that using the CPAP machine helps with sleep quality  at night, as well as his functionality during the day.  He does also  still complain of shortness of breath with exertion, but this appears to  be stable without any significant deterioration.   What I have recommended is that he be persistent with his use of his  CPAP machine, and that as he becomes more accustomed to it, its use  should become easier.  I have also given him a sample of Astelin nasal  spray which he is to use 2 sprays in each nostril twice daily, as  needed, to see if this helps alleviate his rhinorrhea.  If this is still  a persistent problem after this, then consideration could be given to  starting him on Atrovent nasal spray.   Otherwise I plan on following up with him in approximately 2-3 months.     Coralyn Helling, MD  Electronically Signed    VS/MedQ  DD: 07/20/2006  DT: 07/20/2006  Job #: [id number]   cc:   Valetta Mole. Swords, MD  Jesse Sans. Wall, MD, [facility]

## 2010-09-20 NOTE — Discharge Summary (Signed)
[name], [name] NO.:  [id number]   MEDICAL RECORD NO.:  [id number]          PATIENT TYPE:  INP   LOCATION:  2001                         FACILITY:  [facility]   PHYSICIAN:  Evelene Croon, M.D.     DATE OF BIRTH:  07-01-1918   DATE OF ADMISSION:  02/05/2004  DATE OF DISCHARGE:  02/17/2004                                 DISCHARGE SUMMARY   HISTORY OF THE PRESENT ILLNESS:  This is an 77 year old male referred to Dr.  Laneta Simmers for surgical opinion.  The patient has a history of cerebrovascular  occlusive disease, status post right carotid endarterectomy in 2002 and he  was also noted to be in atrial fibrillation by his primary physician Dr.  Gershon Crane.  He had a stress Cardiolite exam, which showed a small inferior  wall infarction with moderate peri-infarction ischemia and an ejection  fraction of 63%.  He subsequently underwent cardiac catheterization December 22, 2003, which revealed significant multivessel coronary artery disease.  There was also trace aortic insufficiency, but no gradient across the aortic  valve on pullback.  Patient was felt to be a surgical revascularization  candidate and is admitted this hospitalization for this procedure.   PAST MEDICAL HISTORY:  The past medical history includes:  1.  Cerebrovascular disease status post right carotid endarterectomy, Dr.      Brayton Layman, in 2002.  2.  The patient also had mediastinal adenopathy and bilateral pleural      plaques and underwent fiberoptic bronchoscopy and mediastinoscopy by Dr.      Edwyna Shell in October 2003.  There was no malignancy on pathology.  3.  The patient also has a history of prostate cancer status post radiation      treatments.  4.  The patient has a history of hypertension.  5.  The patient has a history of gastroesophageal reflux.  6.  The patient has a history of osteoarthritis.  7.  The patient has a history of anemia.   MEDICATIONS:  Medications prior to admission:  1.   Aspirin 325 mg daily.  2.  Indapamide 1.25 mg daily.  3.  Toprol XL 50 mg daily.  4.  Lipitor 20 mg daily.  5.  Colace daily.  6.  Ocuvite daily.  7.  Garlic and parsley at night.  8.  Evening primrose daily.  9.  Omega-3 oils daily.  10. Zantac 75 mg daily.  11. Indomethacin 50 mg t.i.d., which was recently started for gout.  12. Hydrocodone p.r.n. for pain.   ALLERGIES:  None.   FAMILY HISTORY:  Please see the history and physical done at the time of  admission.   SOCIAL HISTORY:  Please see the history and physical done at the time of  admission.   REVIEW OF SYSTEMS:  Please see the history and physical done at the time of  admission.   PHYSICAL EXAMINATION:  Please see the history and physical done at the time  of admission.   HOSPITAL COURSE:  The patient was admitted electively and on February 05, 2004 was taken to the  operating room where he underwent the following  procedure.  Coronary artery bypass grafting times four.  The following  grafts were placed:  1.  Left internal mammary artery to the LAD.  2.  Saphenous vein graft to the diagonal.  3.  Saphenous vein graft to the obtuse marginal.  4.  Saphenous vein graft to the right coronary artery.   The patient tolerated the procedure well and was taken to the surgical  intensive care unit in stable condition.   Postoperative Hospital Course:  The postoperative hospital course was  initially remarkable for anemia requiring transfusion.  Additionally, he had  sinus bradycardia requiring atrial pacing.  Subsequently he went into atrial  fibrillation.  This occurred for several days post discharge with  adjustments in medications made.  Additionally, postoperatively he had some  increase in his creatinine.  This did stabilize.  The patient had some  postoperative volume overload requiring diuresis.  All routine lines,  monitors and drainage devices were discontinued in a standard fashion.  The  patient was started  on Coumadin for the atrial fibrillation.  His incisions  were healing well without evidence of infection.  He continued to make  adequate progress and on February 17, 2004 he was felt to be in adequate  condition for discharge.  PT INR on that day was 20.9 and 2.3 respectively.  Creatinine on that date was 2.5 and BUN 55.   DISCHARGE MEDICATIONS:  Medications on discharge include:  1.  Lopressor 25 mg three times daily.  2.  Lipitor 20 mg daily,  3.  Amiodarone 400 mg twice daily for seven days and then once daily.  4.  Coumadin 2.5 mg 1/2 tablet daily.  5.  Lasix 80 mg daily for 21 days.  6.  K-Dur 40 mEq daily for 21 days.  7.  For pain, Tylox 1-3 every 4-6 hours as needed.   DISCHARGE INSTRUCTIONS:  The patient received written instructions regarding  medications, activities, diet, wound care, and follow up.   FOLLOW UP:  PT INR at the [facility] on February 19, 2004.  Additionally, patient is to see Dr. Daleen Squibb on Monday, March 11, 2004, at  4:15; also, Dr. Laneta Simmers in one week follow up post discharge.   DISCHARGE CONDITION:  The condition on discharge is stable and improved.   DISCHARGE DIAGNOSES:  1.  Coronary artery disease.  2.  Postoperative atrial fibrillation.  3.  Postoperative anemia.  4.  Postoperative renal insufficiency.   OTHER DIAGNOSES:  As previously listed in the history.      Domenica Fail   WEG/MEDQ  D:  03/27/2004  T:  03/28/2004  Job:  [id number]   cc:   Thomas C. Wall, M.D.

## 2010-09-20 NOTE — Op Note (Signed)
NAMEYOVAN, LEEMAN NO.:  [id number]   MEDICAL RECORD NO.:  [id number]          PATIENT TYPE:  INP   LOCATION:  3715                         FACILITY:  [facility]   PHYSICIAN:  Doylene Canning. Ladona Ridgel, M.D.  DATE OF BIRTH:  [birth year]   DATE OF PROCEDURE:  DATE OF DISCHARGE:  11/18/2005                                 OPERATIVE REPORT   PROCEDURE PERFORMED:  Upgrade from a dual-chamber pacemaker to a  biventricular pacemaker with removal of a previously implanted dual-chamber  pacemaker and insertion of a new biventricular pacemaker.   INTRODUCTION:  The patient is an 77 year old man with complete heart block  who underwent permanent pacemaker insertion several years ago.  He has  developed worsening exercise performance and increasing heart failure  symptoms all in the setting of a very wide QRS (190 milliseconds) secondary  to RV pacing.  He is now referred for upgrade to a dual-chamber  biventricular pacemaker.   PROCEDURE:  After informed consent was obtained, the patient was taken to  the diagnostic EP lab in a fasting state.  After the usual preparation and  draping, intravenous fentanyl and midazolam was given for sedation.  30 mL  lidocaine was infiltrated over the old pacemaker insertion site.  A 5 cm  incision was carried out over this region.  Electrocautery was utilized to  dissect down to the fascial plane.  Care was taken not to enter the  pacemaker pocket.  10 mL of contrast was injected into the left upper  extremity venous system demonstrating a patent left subclavian vein.  It was  subsequently punctured and the Medtronic guiding catheter was advanced over  a guide wire into the right atrium.  A 6-French hexapolar coronary sinus  catheter was advanced through the guide wire to the guiding catheter and the  coronary sinus was cannulated.  Venography of the coronary sinus was  subsequently carried out.  This demonstrated a single suitable lateral vein  which unfortunately had a sharp shepherd's crook.  Despite this initial  unfavorable anatomy, the vein was cannulated utilizing the Medtronic Attain  subselective introducer.  Having accomplished this, the Medtronic model  4193, 80-cm LV pacing lead, serial number [id number], was advanced through  the subselective guiding catheter into the lateral vein.  The catheter was  advanced approximately 1/3 from the base to apex, but having traversed the  shepherd's crook.  Having accomplished this, pacing thresholds were carried.  The LV waves measured 10 mV and LV threshold 1 volt at 0.4 milliseconds.  10  volt pacing did not stimulate the diaphragm.  With these satisfactory  parameters, the guiding catheters were removed without difficulty and the  lead was secured to the subpectoralis fascia with a figure-of-eight silk  suture.  Kanamycin irrigation was utilized to irrigate the incision.  At  this point, electrocautery was utilized to enter the previously implanted  dual chamber pacemaker pocket and the dual chamber Medtronic pacemaker was  removed without difficulty.  The old right atrial and right ventricular  leads, which were both Medtronic model 5076 active fixation leads implanted  in August 2006, were removed and then connected to the new Medtronic Insync  III biventricular pacemaker.  The pacemaker serial number was [id number].  Having accomplished all of this, the generator was placed back in the  subcutaneous pocket and the incision was closed with layer of 2-0 Vicryl  followed by layer of 3-0 Vicryl followed by layer of 4-0 Vicryl.  It should  be noted that kanamycin was utilized to irrigate the pocket and  electrocautery was utilized to assure hemostasis prior to closure of the  pocket.           ______________________________  Doylene Canning. Ladona Ridgel, M.D.     GWT/MEDQ  D:  12/11/2005  T:  12/11/2005  Job:  [id number]   cc:   Jeannett Senior A. Clent Ridges, M.D. [facility]

## 2010-09-20 NOTE — Discharge Summary (Signed)
NAMESHREYAN, HINZ NO.:  [id number]   MEDICAL RECORD NO.:  [id number]          PATIENT TYPE:  INP   LOCATION:  2007                         FACILITY:  [facility]   PHYSICIAN:  Jesse Sans. Wall, M.D.   DATE OF BIRTH:  1918/08/18   DATE OF ADMISSION:  12/06/2004  DATE OF DISCHARGE:  12/10/2004                                 DISCHARGE SUMMARY   DISCHARGE DIAGNOSES:  1.  Discharging day 1 status post implant of Medtronic En Pulse DDD      permanent pacemaker.  It is an E2DR01.  2.  Tachybrady syndrome with symptomatic bradycardia/pauses (dizziness,      fatigue, dyspnea).  3.  Persistent atrial fibrillation.   SECONDARY DIAGNOSIS:  1.  History of three vessel coronary artery disease status post coronary      artery bypass graft surgery October, 2005.  2.  Post coronary bypass atrial fibrillation.  3.  Dyslipidemia.  4.  Extracranial cerebrovascular occlusive disease status post right carotid      endarterectomy.  5.  Gout.  6.  Family history of coronary artery disease.   PROCEDURE:  December 09, 2004, implantation of Medtronic DDD dual chamber  permanent pacemaker by Dr. Lewayne Bunting by way of the left subclavian vein,  no immediate complications, patient in atrial fibrillation.   DISCHARGE DISPOSITION:  Mr. [name] is ready for discharge day 1 after  implantation of pacemaker.  He has had no post procedure complications, he  remains in atrial fibrillation.  His PT on the day of discharge was 16.0,  INR 1.3.  He is on 5 mg of Coumadin a day, this has been restarted the day  of the procedure and he will go home with 5 mg daily.  His incision is  healing nicely without erythema, swelling or drainage.  Chest x-ray shows  leads in appropriate position, interrogation shows that all values are  within normal limits.  Mr. [name] goes home with the following medications:  1.  Coumadin 5 mg daily.  2.  Lipitor 20 mg tablets 1/2 tab daily at bedtime.  3.  Toprol XL  50 mg daily.  4.  Digoxin 0.125 mg one tab every other day.  5.  Enteric coated aspirin 81 mg daily.  6.  Colchicine 0.6 mg daily.  7.  Ocuvite daily.   He is asked to keep his incision dry for the next 7 days and sponge bathe  until Monday, December 16, 2004, Tylenol for pain, 325 mg, 1-2 tabs every 4-6  hours.  He has followup at [facility].  1.  Coumadin Clinic Tuesday, December 17, 2004, at 8:45.  2.  The second appointment is the [facility] Wednesday, December 25, 2004,      at 9 a.m.  Dr. Ladona Ridgel will also see the patient at that time.  PLAN will      be to start amiodarone if the Coumadin dose is therapeutic.  The idea of      amiodarone will be to establish sinus rhythm with atrial pacing.  This  will keep ventricular pacing to a minimum.  3.  To see Dr. Ladona Ridgel on Thursday, March 27, 2005, at 9:10 a.m.   Mr. [name] is an 77 year old male with a history of coronary artery  disease.  He is status post coronary artery bypass graft surgery October,  2005.  He did develop postoperative atrial fibrillation at that time.  It is  unknown if he had atrial fibrillation prior to bypass.  The patient was  initially bradycardic and could not tolerate any AV nodal blocking drugs.  He has done fairly well after bypass surgery until about a month ago when he  noted increasing dyspnea, palpitation and fatigue.  He has had no frank  syncope but has had some dizzy spells.  He occasionally developed blurred  vision which lasted for about a minute.  A 48 hour monitor was placed and  demonstrated atrial fibrillation with pauses greater than 4 seconds.  Now  referred for consideration of permanent pacemaker implantation.   HOSPITAL COURSE:  The patient presented to [facility].  Patient was  admitted with known tachybrady syndrome and with pauses.  On admission he  was placed on IV heparin, which was discontinued prior to implantation of  pacemaker.  Cardiac enzymes were cycled  and troponin-I studies were as  follows serially, 0.03 then 0.02 then 0.03.  He was seen in consultation by  Dr. Lewayne Bunting who recommended implantation of permanent pacemaker for  tachybrady syndrome with symptomatic bradycardia and pauses.  This procedure  was done on Monday, December 09, 2004, and the patient received a Medtronic En  Pulse DDD permanent pacemaker.  He had no complications following the  procedure, he remains in atrial fibrillation at controlled ventricular  rates, heart rate about 69-80 with an occasional spike into paroxysms of  about 100.  Once again, PT on the day of discharge was 16, INR 1.3.  Serum  electrolytes on December 09, 2004, sodium 139, potassium 4.2, chloride 107,  carbonate 29, glucose is 82, BUN is 22, creatinine 1.3.  Complete blood  count on December 09, 2004, white cells 7.4, hemoglobin 11.9, hematocrit 36.3  and platelets 215.  His hemoglobin has been relatively stable this  hospitalization.  Liver function studies:  Alkaline phosphatase is 152, SGOT  is 31, SGPT is 25.  Fasting lipid profile:  Cholesterol 82, triglyceride 46,  HDL cholesterol 36, and LDL cholesterol 37, TSH was 2.971.       GM/MEDQ  D:  12/10/2004  T:  12/10/2004  Job:  [id number]   cc:   Jeannett Senior A. Clent Ridges, M.D. [facility]   Maisie Fus C. Wall, M.D.   Doylene Canning. Ladona Ridgel, M.D.

## 2010-09-20 NOTE — Assessment & Plan Note (Signed)
[facility]ALTHCARE                            CARDIOLOGY OFFICE NOTE   NAME:[name], [name]                       MRN:          [id number]  DATE:05/14/2006                            DOB:          [birth year]    Mr. Tirrell returns today for followup of particularly his generalized  fatigue and weakness and multiple complaints outlined on the note  May 01, 2006.   At that time, I changed his Propranolol to Carvedilol 6.25 b.i.d.  He  insists on stopping his Coumadin because he felt like that was causing  him to feel bad.  Stopping his Coumadin had no change in his symptoms.  He has developed increased swelling, and his wife feels like he needs to  be on a higher dose of furosemide than 20 mg.   Of note, his sleep study did show obstructive sleep apnea, and it was  felt that he would probably benefit from treatment.  He is seeing Dr.  Craige Cotta in the Sleep Clinic next Monday.  This could have substantial  bearing on how he feels.   Med changes are noted in the chart.   His blood pressure today is 164/92.  His pulse is 64 and he is regular.  His weight is 154, down 2.  HEENT:  Normocephalic and atraumatic.  PERRLA.  Extraocular movements  intact.  Sclerae are clear.  Carotid upstrokes are equal bilaterally without bruits.  There is no  JVD.  Thyroid is not enlarged.  LUNGS:  Reveal crackles in both bases, which are baseline.  HEART:  Reveals a regular rate and rhythm without gallop.  There is a  soft systolic murmur along the left sternal border.  ABDOMINAL EXAM:  Soft with good bowel sounds.  EXTREMITIES:  Reveal 2 plus pitting edema, left worse than right.  Pulses are present.  NEUROLOGIC EXAM:  Intact.   PLAN:  1. Follow up with Dr. Craige Cotta.  Hopefully, treatment of his sleep apnea      will help his generalized fatigue and just feeling bad.  2. Continue Carvedilol in hopes this will make a difference after      being on it for 4 to 6 weeks.  3.  Restart Coumadin to reduce his thromboembolic risk.  He will      decrease his aspirin to 81 mg a day.  4. Increase his furosemide to 40 mg a day.  5. I will plan on seeing him back in 4 to 6 weeks.     Thomas C. Daleen Squibb, MD, [facility]  Electronically Signed    TCW/MedQ  DD: 05/14/2006  DT: 05/14/2006  Job #: [id number]   cc:   Jeannett Senior A. Clent Ridges, MD  Coralyn Helling, MD

## 2010-09-20 NOTE — Assessment & Plan Note (Signed)
[facility]                            CARDIOLOGY OFFICE NOTE   NAME:Andujar, DEVARION MCCLANAHAN                       MRN:          [id number]  DATE:09/07/2006                            DOB:          [birth year]    Mr. Drolet comes in today for further management of the following  issues:  1. Coronary artery disease status post coronary artery bypass      grafting. He has mild left ventricular systolic dysfunction, EF of      40-45%, by 2D echocardiogram on December 03, 2004.  2. Mild to moderate aortic stenosis.  3. Right-sided congestive heart failure now with increasing edema,      fatigue and shortness of breath. A 2D echo on December 03, 2004,      showed moderate pulmonary hypertension, several right atrial      enlargement, moderate tricuspid regurgitation and mild decrease in      right ventricular function.  4. Atrial fibrillation. Pacer interrogation January 2008, showed no      evidence of atrial fibrillation. Appears well-controlled on      amiodarone and is mostly AV paced.  5. Chronic anemia. Last hemoglobin 10.8. His iron stores were      borderline low. He has a history of peptic ulcer disease.  6. Obstructive sleep apnea: He has been on CPAP, which I strongly      suggested last time. This has not helped his fatigue of his energy      levels.   His Lasix was doubled back in April. He is now on 40 mg p.o. b.i.d. his  swelling is getting worse in his lower extremities and the Lasix just  washes him out.   He denies any orthopnea. He can do stretching exercises, but walking  makes him short of breath. His O2 sat on room air has been about 94% per  his record.   CURRENT MEDICATIONS:  1. Prilosec 30 mg a day.  2. Carvedilol 6.25 b.i.d.  3. Amiodarone 200 mg a day.  4. Aspirin 81 mg a day.  5. Coumadin as directed.  6. Ferrous sulfate b.i.d.  7. Ocuvite.  8. Multivite.  9. Metamucil.  10.Potassium 20 mEq a day.  11.MiraLax daily.  12.Lasix  40 mg p.o. b.i.d.   Recent blood work showed a creatinine of 1.5, potassium 4.9. Lipids were  at goal. TSH was normal at 3.51. LFTs were normal.   PHYSICAL EXAMINATION:  He looks pale. He is weak and he is dyspneic at a  rate of about 22, with shallow breathes. He is very pleasant, but looks  exhausted.  HEENT: Normocephalic, atraumatic. PERRLA. Extra-ocular movements intact.  Sclerae are clear. Conjunctivae are pale.  NECK: Shows JVD to the angle of the jaw.  He has a positive hepatojugular reflux with a palpable liver about 3  finger breaths below the right costal margin. His thyroid is not  enlarged. Carotids are full.  LUNGS:  Reveal decreased breath sounds in the left base.  HEART: Reveals no obvious right ventricular lift. There is no gallop. He  has a regular  rate and rhythm.  ABDOMEN: Otherwise, soft. Bowel sounds are present.  EXTREMITIES: Reveal 2+ pitting edema up to the knees. His pulses were  poorly appreciated.  NEURO: Intact.   EKG shows AV pacing.   ASSESSMENT/PLAN:  Mr. Nyland continues to deteriorate. It looks like he  is predominantly right heart failure at this time. It is unfortunate  that the CPAP did not help. The Lasix at a higher dose washes him out.   I have made no changes in medical program today. We will obtain a 2D  echocardiogram. Once I obtain these results, I will discuss the case  with Dr.  Gala Romney and see if there is anything else we can offer this  poor gentleman.     Thomas C. Daleen Squibb, MD, [facility]  Electronically Signed    TCW/MedQ  DD: 09/07/2006  DT: 09/07/2006  Job #: [id number]   cc:   Jeannett Senior A. Clent Ridges, MD

## 2010-09-20 NOTE — Assessment & Plan Note (Signed)
[facility]                           ELECTROPHYSIOLOGY OFFICE NOTE   NAME:Scearce, CAHLIL SATTAR                       MRN:          [id number]  DATE:02/25/2006                            DOB:          11/22/1918    Mr. Duley returns today for follow-up.  He is a very pleasant elderly man  who has a history of complete heart block, status post pacemaker insertion  resulting in requirement for upgrade to a bi-V pacemaker secondary to severe  fatigue.  He has moderate aortic stenosis and moderate TR.  The patient had  a large pocket hematoma following his bi-V upgrade.  Now this has resolved.  The patient also developed atrial fibrillation and underwent cardioversion a  month ago and has maintained sinus rhythm very nicely on amiodarone.  The  patient states that while he still feels fatigued and tired, he has had some  better days.  He denies cough or hemoptysis, fevers or chills.  He has very  minimal peripheral edema.   PHYSICAL EXAMINATION:  GENERAL:  He is a pleasant, elderly-appearing man in  no acute distress.  VITAL SIGNS:  Blood pressure was 119/73, the pulse 59 and regular,  respirations were 18.  His weight was 153 pounds, which is essentially  unchanged from back in May.  It is up 3 pounds from August.  NECK:  No jugular venous distention.  LUNGS:  Clear bilaterally to auscultation except for rales in the bases.  CARDIOVASCULAR:  A regular rate and rhythm, normal S1 and a split S2.  ABDOMEN:  Scaphoid, soft, nontender, nondistended.  EXTREMITIES:  Trace peripheral edema bilaterally.   IMPRESSION:  1. Symptomatic bradycardia with pacemaker-induced bundle branch block.  2. Congestive heart failure.  3. Hypertension.   DISCUSSION:  I have discussed treatment options with Mr. Maddocks.  He has had  a history of anemia in the past, and we will check a CBC today as his  hemoglobin was 11 most recently.  I would say overall he has improved,  though  he is still not as good as he would like to be.  I suspect his  fatigue and weakness are multifactorial, though there is a hint that his new  LV lead has helped him some.            ______________________________  Doylene Canning. Ladona Ridgel, MD    GWT/MedQ  DD:  02/25/2006  DT:  02/26/2006  Job #:  [id number]

## 2010-09-20 NOTE — H&P (Signed)
[name], [name] NO.:  [id number]   MEDICAL RECORD NO.:  [id number]          PATIENT TYPE:  EMS   LOCATION:  MAJO                         FACILITY:  [facility]   PHYSICIAN:  Learta Codding, M.D. LHCDATE OF BIRTH:  1918/05/25   DATE OF ADMISSION:  03/21/2004  DATE OF DISCHARGE:                                HISTORY & PHYSICAL   REASON FOR ADMISSION:  Severe bradycardia with dizziness.   HISTORY OF PRESENT ILLNESS:  The patient is an 77 year old white male with  history of severe coronary artery disease recently diagnosed as well as  underlying sick sinus syndrome and preoperative paroxysmal atrial  fibrillation.  On a recent cardiac catheterization, the patient was found to  have multivessel disease with severe left main coronary artery disease.  He  underwent bypass surgery performed by Dr. Laneta Simmers on February 05, 2004.  The  patient had reserved left ventricular function.  Postoperatively, the  patient developed atrial fibrillation and he was placed on anticoagulation.  After therapeutic anticoagulation for four weeks, the patient was brought  back to the hospital yesterday for an elective cardioversion on amiodarone  therapy.  The patient was successfully converted to normal sinus rhythm.  Today, the patient noted increased dizziness and weakness, and near syncope.  He presented to the emergency room and was found to be in severe bradycardia  with heart rate of 35 beats/minute.  Of note, his medication including  amiodarone and Lopressor had been continued after his cardioversion.  The  amiodarone was dosed at 200 mg p.o. b.i.d. and Lopressor at 25 mg p.o.  b.i.d. which was decreased in dosing yesterday.  The patient now reports no  substernal chest pain.  He has no shortness of breath, orthopnea, PND.  His  blood pressure is stable both lying and standing at 107/55 mmHg.   ALLERGIES:  No known drug allergies.   MEDICATIONS:  1.  Amiodarone 200 mg p.o. b.i.d.  2.  Kay Ciel 20 mEq p.o. daily.  3.  Lasix 80 mg daily.  4.  Lopressor 25 mg p.o. b.i.d.  5.  Lipitor 20 mg 1/2 tablet q.h.s.  6.  Coumadin 5 mg 1/2 tablet daily.  7.  Docusate p.r.n.   PAST MEDICAL HISTORY:  Right carotid artery disease status post carotid  endarterectomy, history of prostate cancer requiring XRT November 2000.  History of hypertension, gastroesophageal reflux disease, history of  arthritis, history of coronary artery bypass grafting as outlined above last  month.   FAMILY HISTORY:  Mother died at age 27 with heart disease and  cerebrovascular accident.  Father died age 38 with heart disease.  Sister  died also of heart disease.  One brother died of lung disease at age 12.   SOCIAL HISTORY:  The patient has been married for over 60 years.  He has  three grown children.  He is retired.  He denies any tobacco intake since  1969.   REVIEW OF SYSTEMS:  As outlined above.  The patient reports no chest pain.  He does have right lower extremity edema which was diagnosed during  his  recent hospitalization.  He also reports presyncope.  As outlined above, he  has no nausea or vomiting.  Positive for weakness and diffuse arthralgias.  No polyuria, polydipsia.   PHYSICAL EXAMINATION:  VITAL SIGNS:  Blood pressure 107/55 with heart rate  of 37 lying, and 105/46 with heart rate of 38 standing.  GENERAL:  The patient is well-nourished.  Pale in appearance.  HEENT:  NCAT. PERRLA.  EOMI.  NECK:  Supple.  No carotid upstroke.  No carotid bruits.  HEART:  Regular rate and rhythm with distant heart sounds.  There is a 2/6  crescendo/decrescendo murmur at the left upper sternal border.  SKIN:  Demonstrates no rash or lesions.  ABDOMEN:  Soft, nontender.  GU/RECTAL:  Deferred.  EXTREMITIES:  Reveals area of induration and tenderness along the left inner  aspect of the calf.  There is no fluctuance, but there is significant  tenderness.  There is also 1-2+ peripheral pitting edema  and 1+ pitting  edema in the left leg.  Peripheral pulses are palpable.  No definite  palpable cord is found.   Chest x-ray is pending.  EKG demonstrates sinus bradycardia with heart rate  of 37 beats/minute, nonspecific STT wave changes.  Hemoglobin 11.6, white  count 9.6, platelet count 200.  Other labs are currently pending.   PROBLEM LIST:  1.  Sinus bradycardia s/post elective cardioversion March 20, 2004.  2.  Preoperative sick sinus syndrome and atrial fibrillation.  3.  Coronary artery disease status post coronary artery bypass grafting for      left main coronary artery disease/MVD.  4.  Preserved left ventricular systolic function.  5.  Peripheral vascular disease status post right carotid endarterectomy.  6.  History of hypertension.  7.  History of dyslipidemia.  8.  R LE swelling and pain.   PLAN:  1.  Will hold all the patient's rate lowering medications including      Lopressor and amiodarone.  2.  Hold Lasix today.  3.  Obtain lower extremity Dopplers to rule out deep vein thrombosis in the      right lower extremity.  4.  Monitor heart rate carefully.  Hold off on Atropine for now as the      patient has stable BP. If he develops symptomatic bradycardia , he may      need intravenous Atropine and or temporary pacing.  5.  Anticoagulation.  Continue Coumadin for now as well as long-term.      Monitor PT, INR.  6.  EP evaluation in the morning.  I anticipate the patient ultimately may      need a permanent pacemaker.  He has      sick sinus syndrome with tachybrady syndrome documented preoperatively.      If the patient did not have sick sinus disease and/or atrial      fibrillation prior to surgery we could take probably  a more expectant      approach. It's unclear at this point if he will need longterm amiodarone      or rate control with PPM. We have notified EP.      Michelle Piper  GED/MEDQ  D:  03/21/2004  T:  03/21/2004  Job:  [id number]   cc:   Thomas C. Wall,  M.D.   Tera Mater. Clent Ridges, M.D. [facility]

## 2010-09-20 NOTE — Discharge Summary (Signed)
[name], [name] NO.:  [id number]   MEDICAL RECORD NO.:  [id number]          PATIENT TYPE:  INP   LOCATION:  3739                         FACILITY:  [facility]   PHYSICIAN:  [name], M.D. LHCDATE OF BIRTH:  10-23-1918   DATE OF ADMISSION:  03/21/2004  DATE OF DISCHARGE:  03/25/2004                                 DISCHARGE SUMMARY   DISCHARGE DIAGNOSIS:  1.  Admitted with symptomatic bradycardia/dizziness in a patient on      Amiodarone and Lopressor, both discontinued this admission.  2.  Sick sinus syndrome/tachy-brady syndrome with symptoms improving after      taken off anti-arrhythmic and AV nodal drugs.  3.  Holding off at present on planned pacemaker placement.   SECONDARY DIAGNOSIS:  1.  Atrial fibrillation on chronic Coumadin therapy status post DC      cardioversion November 16 maintaining sinus rhythm.  2.  History of severe three vessel coronary artery disease status post      coronary artery bypass graft surgery in October 2005 with post coronary      artery bypass graft surgery atrial fibrillation.  3.  Extracranial cerebrovascular occlusive disease status post right carotid      endarterectomy.  4.  Hypertension.  5.  Dyslipidemia.  6.  History of prostate cancer status post radiation therapy.  7.  Degenerative joint disease.   PROCEDURE:  2D echocardiogram done March 21, 2004, the study shows  ejection fraction 55-65%, moderate aortic stenosis, mild mitral  regurgitation, right ventricular systolic function mildly reduced, aortic  valve area 1 cm square, mean transaortic valve gradient was 16 mmHg.   DISCHARGE DISPOSITION:  [name] is discharged on November 21 after  five day hospital stay at [facility].  He had been admitted with  symptomatic bradycardia with heart rates as low as the 30s and 40s.  His  heart rates moderated after being taken off Amiodarone and Lopressor.  By  the time of discharge five days  later, the patient's heart rate was in the  50s to 60s.  The patient was no longer having symptomatic dizziness or woozy  feelings.  The patient was discharged to be maintained on Coumadin and with  follow up with Dr. Daleen [name] in two weeks.   DISCHARGE MEDICATIONS:  He goes home on the following medications:  1.  Coumadin 5 mg tablets alternating with 2.5 mg every other day.  2.  Lasix 80 mg daily.  3.  Potassium chloride 20 mEq 2 daily.  4.  Lipitor 20 mg 1/2 tablet in the evening.  5.  Docusate sodium 100 mg as needed.   BRIEF HISTORY:  [name] is an 77 year old male with a history of sick  sinus syndrome and atrial fibrillation, which precipitated a cardiac workup  in August 2005.  Cardiolite study showed inferior scar and peri-infarction  ischemia.  Subsequent catheterization on December 22, 2003, showed left main  and three vessel coronary artery disease leading to coronary artery bypass  graft surgery in October 2005.  The patient had post bypass atrial  fibrillation  and has been on Coumadin at therapeutic levels for the last  four weeks.  The patient had DC cardioversion on November 16, the day before  this admission, and was continued on Amiodarone and Lopressor.  After  cardioversion, the patient went home feeling fine and slept well but on  getting up in the morning of November 17, he felt dizzy and woozy, the  patient's wife had trouble getting a pulse.  He had no chest pain, no  dyspnea.  He presented to the emergency room  this morning after calling  [facility] office.  He was found to have severe bradycardia with  heart rates in the high 30s.  Both Amiodarone and Lopressor were placed on  hold but after 5-6 hours, the patient still has bradycardia.  The patient  was seen lying in bed without any particular symptoms.   HOSPITAL COURSE:  The patient was admitted with symptomatic bradycardia,  feelings of dizziness and wooziness.  On November 17, 2D echocardiogram was   obtained which showed ejection fraction of 65%.  His Lopressor and  Amiodarone were both discontinued.  He was scheduled for permanent pacemaker  placement but due to schedule constraints, this was unable to be done for  the next 2-3 days.  During that time, the patient's heart rates moderated  upwards and his symptoms have disappeared.  The patient goes home following  with Dr. Daleen [name] in two weeks with the medications as dictated above.      [name]   GM/MEDQ  D:  05/02/2004  T:  05/02/2004  Job:  [id number]   cc:   Thomas C. Wall, M.D.   Duke Salvia, M.D.   Tera Mater. Clent Ridges, M.D. [facility]

## 2010-09-20 NOTE — Assessment & Plan Note (Signed)
[facility]                         ELECTROPHYSIOLOGY OFFICE NOTE   NAME:[name], [name]                       MRN:          [id number]  DATE:05/21/2006                            DOB:          November 24, 1918    [name] was seen today in clinic on May 21, 2006 for followup of  his Medtronic model No. 8042 [place] [facility] pacemaker.  Date of implant was  November 17, 2005 for cardiomyopathy.  On interrogation of his device today,  his battery voltage is 3.011.  P-waves measured 1.0 to 1.4 millivolts  with an atrial capture threshold of 0.5 volts at 0.4 milliseconds and an  atrial lead impedence of 529 ohms.  Right ventricular R-waves measured  8.0 to 11.02 millivolts  with a right ventricular pacing threshold of 1  volt at .4 milliseconds and a right ventricular lead impedence of 506  ohms.  Left ventricular lead impedence measured greater than 11.2  millivolts  with a left ventricular pacing threshold of 1 volt at .4  milliseconds and a left ventricular lead impedence of 592 ohms.  I did  activate his rate response today after looking at his histograms that  were rather flat, and he was complaining of being short of breath and  lack of energy when he is at cardiac rehab.  There has not been any  atrial fibrillation noted or atrial high rates since the end of August.  He appears to be with an underlying sinus rhythm today.  No other  changes made in his parameters.  He will be seen back again in July.      Altha Harm, LPN  Electronically Signed      Doylene Canning. Ladona Ridgel, MD  Electronically Signed   PO/MedQ  DD: 05/21/2006  DT: 05/21/2006  Job #: [id number]

## 2010-09-20 NOTE — Op Note (Signed)
[facility]  Patient:    [name], [name] Visit Number: [id number] MRN: [id number]          Service Type: SUR Location: [id number] Attending Physician:  Caralee Ates Dictated by:   Caralee Ates, M.D. Proc. Date: 01/07/01 Admit Date:  01/07/2001                             Operative Report  PREOPERATIVE DIAGNOSIS:  Right carotid stenosis (symptomatic).  POSTOPERATIVE DIAGNOSIS:  Right carotid stenosis (symptomatic).  OPERATION PERFORMED:  Right carotid endarterectomy with Dacron patch angioplasty.  SURGEON:  Caralee Ates, M.D.  ASSISTANTHyacinth Meeker.  ESTIMATED BLOOD LOSS:  50 cc.  DRAINS:  None.  SPECIMENS:  None.  COMPLICATIONS:  None.  ANESTHESIA:  General endotracheal.  INDICATIONS FOR PROCEDURE:  The patient is an 77 year old gentleman who has been developing intermittent episodes of left hand discoordination that have resolved spontaneously over a matter of minutes.  He also had some difficulty with some numbness on his left face and shoulder.  He was evaluated by Doppler ultrasound and found to have a high grade (greater than 80%) right carotid stenosis.  He was felt to be a good candidate for right carotid endarterectomy for stroke prophylaxis.  He underwent preoperative cardiac risk stratification by Dr. Daisey Must and was felt to be a good candidate for surgery.  The risks, benefits, and alternatives of the procedure were explained in detail to the patient and he agreed to proceed.  DESCRIPTION OF PROCEDURE:  The patient was brought to the operating room and placed on the operating table in supine position.  Following adequate general endotracheal anesthesia, the head was rotated to the left exposing the right neck.  The right neck was then prepped and draped in sterile fashion.  An oblique incision was made along the anterior border of the sternocleidomastoid muscle.  The wound was deepened using the Bovie to control bleeding.   The common carotid artery, internal carotid artery and external carotid artery were identified and each encircled with an umbilical tape and vessel loops respectively.  The hypoglossal and vagus nerves were identified during the dissection and protected throughout the remainder of the operation.  The patient then was systemically heparinized.  Following an adequate three minute circulation time, the internal carotid artery, external carotid artery and common carotid artery were occluded in sequential fashion.  A longitudinal arteriotomy was performed to the common carotid artery.  The arteriotomy was extended across the bifurcation onto the internal carotid artery.  Next, the 10 [place] shunt was used to cannulate the internal carotid artery followed by being placed in the common carotid artery reperfusing the right internal carotid artery circulation.  An endarterectomy plane was then developed and carried down proximally where the plaque was transected and then the plaque was elevated proximal to distal.  An eversion endarterectomy was performed on the external carotid artery and then the plaque extending on the  ICA was mobilized to a good end point.  There was a small amount of debris at the end point.  The arteriotomy was extended slightly.  The remainder of the loose debris was removed and there was an area of elevation where the plaque did not quite fit it perfectly and this was secured into position with interrupted 7-0 tacking sutures.  Next, the endarterectomy site was inspected for all movable atheromatous debris and all was manually debrided.  Once  a satisfactory result with no more movable debris had been achieved, a Dacron patch was selected and sewn into position with a running 6-0 Prolene suture.  Prior to completion of the anastomosis, the shunt was removed and the ICA and CCA were flushed and back-bled.  The anastomosis was then completed and flow was then restored to the  external carotid artery and then restored to the internal carotid artery. At this point there was no significant bleeding from the anastomotic suture line that was not easily controlled with direct pressure.  The wound was then irrigated copiously with warm sterile saline and hemostasis was achieved.  The wound was then closed in layers of 3 and 4-0 Vicryl suture.  Dry sterile dressings were applied.  The patient was then awakened from anesthesia and he was able to move all four extremities on command.  He was then transferred to the recovery room in stable condition.  The patient tolerated the procedure well.  There were no complications.  All sponge and needle counts were reported as correct. Dictated by:   Caralee Ates, M.D. Attending Physician:  Caralee Ates. DD:  01/07/01 TD:  01/07/01 Job: [id number]

## 2010-09-20 NOTE — Op Note (Signed)
NAMESHAWNE, ESKELSON NO.:  [id number]   MEDICAL RECORD NO.:  [id number]          PATIENT TYPE:  OIB   LOCATION:  2899                         FACILITY:  [facility]   PHYSICIAN:  Jesse Sans. Wall, M.D.   DATE OF BIRTH:  1919/04/23   DATE OF PROCEDURE:  03/20/2004  DATE OF DISCHARGE:  03/20/2004                                 OPERATIVE REPORT   PROCEDURE:  Cardioversion.   INDICATIONS FOR PROCEDURE:  Atrial fibrillation.   LABORATORY DATA:  Stable.  INR 3.   Informed consent obtained.  Dr. Jairo Ben of anesthesia gave 225 mg  of Pentothal.  Cardioversion was synchronized biphasic current 100 joules to  sinus brady at 48 per minute.  There were no complications.  Post  cardioversion EKG shows a sinus brady at 54 with a PR interval of 208  milliseconds with some PACs.   PLAN:  1.  Decrease Lopressor to 25 b.i.d.  2.  Follow up in the office with me in two weeks, April 04, 2004, at 3:30      p.m., appointment given.  3.  Continue other medications.      Scharlene Corn   TCW/MEDQ  D:  03/20/2004  T:  03/20/2004  Job:  [id number]   cc:   Tera Mater. Clent Ridges, M.D. [facility]

## 2010-09-20 NOTE — Assessment & Plan Note (Signed)
[name]                           ELECTROPHYSIOLOGY OFFICE NOTE   NAME:[name], [name]                       MRN:          [id number]  DATE:02/12/2006                            DOB:          [birth year]    [name] is seen today, I am not quite sure why. He is a patient of Dr.  Vern [name] and Dr. Lubertha [name].  He is status post recent cardioversion and he  comes in continuing to complain of shortness of breath and fatigue. He is in  sinus rhythm.   I reviewed the chart rather modestly. He has been on amiodarone for a long  time. DLCOs recently were at 85%. 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 He has been on Toprol for a long long time. His wife says that he  falls asleep all the time, does not have significant snoring however.   MEDICATIONS:  He also complains of muscle aches in his legs. His Lipitor was  discontinued by Dr. Ladona [name] and this is somewhat improved.   His other medications include the amiodarone, aspirin, Coumadin, Zantac and  Toprol 50 mg a day having been down titrated from 100.   PHYSICAL EXAMINATION:  VITAL SIGNS:  His blood pressure today was 152/83,  his pulse was 70.  LUNGS:  Clear.  HEART:  Sounds were regular.  EXTREMITIES:  Without edema.  c  Electrocardiogram demonstrated AV pacing.   IMPRESSION:  1. Previously implanted defibrillator now with CRT upgrade.  2. Atrial arrhythmia on amiodarone.  3. Moderate aortic stenosis, severe biatrial enlargement, possible PFO,      moderate tricuspid regurgitation and pulmonary hypertension.  4. Fatigue.  5. Chronic sleepiness.   I am not sure that I know enough to help [name] very much. I have  discussed with Mr. [name] however that we will:   1. Give him a beta blocker trial. I am going to give him prescriptions      today for Inderal LA 80, nadolol 40, and atenolol 50 to try in sequence     to see if any of them will be better tolerated than the Toprol.  2. I have spoken with Dr. Delford [name] and he will arrange for a sleep study.   He will followup with Dr. Ladona [name] as previously scheduled as part of the  device clinic.            ______________________________  Duke Salvia, MD, [facility]     SCK/MedQ  DD:  02/12/2006  DT:  02/14/2006  Job #:  [id number]

## 2011-02-19 LAB — BASIC METABOLIC PANEL
BUN: 35 — ABNORMAL HIGH
BUN: 37 — ABNORMAL HIGH
CO2: 34 — ABNORMAL HIGH
CO2: 35 — ABNORMAL HIGH
Calcium: 8.6
Chloride: 97
Chloride: 97
Chloride: 97
Creatinine, Ser: 1.36
Creatinine, Ser: 1.57 — ABNORMAL HIGH
GFR calc Af Amer: >60
GFR calc Af Amer: >60
GFR calc non Af Amer: 42 — ABNORMAL LOW
GFR calc non Af Amer: 52 — ABNORMAL LOW
Glucose, Bld: 90
Glucose, Bld: 93
Potassium: 4.4
Sodium: 137

## 2011-02-19 LAB — CBC
HCT: 30.8 — ABNORMAL LOW
Hemoglobin: 10 — ABNORMAL LOW
Hemoglobin: 10.6 — ABNORMAL LOW
MCHC: 32.2
MCV: 93.8
Platelets: 111 — ABNORMAL LOW
RBC: 3.29 — ABNORMAL LOW
RDW: 16.3 — ABNORMAL HIGH
WBC: 8.1

## 2011-02-19 LAB — PROTIME-INR
INR: 3.9 — ABNORMAL HIGH
Prothrombin Time: 41 — ABNORMAL HIGH

## 2011-02-20 LAB — DIFFERENTIAL
Basophils Absolute: 0.1
Eosinophils Absolute: 0
Lymphocytes Relative: 11 — ABNORMAL LOW
Monocytes Absolute: 0.5
Monocytes Relative: 7

## 2011-02-20 LAB — COMPREHENSIVE METABOLIC PANEL
ALT: 23
AST: 32
Alkaline Phosphatase: 83
BUN: 43 — ABNORMAL HIGH
GFR calc Af Amer: 54 — ABNORMAL LOW
Sodium: 128 — ABNORMAL LOW
Total Bilirubin: 0.9

## 2011-02-20 LAB — URINE MICROSCOPIC-ADD ON

## 2011-02-20 LAB — BASIC METABOLIC PANEL
BUN: 38 — ABNORMAL HIGH
BUN: 41 — ABNORMAL HIGH
CO2: 37 — ABNORMAL HIGH
CO2: 41 — ABNORMAL HIGH
Calcium: 8.5
Chloride: 94 — ABNORMAL LOW
Creatinine, Ser: 1.44
GFR calc non Af Amer: 47 — ABNORMAL LOW
Glucose, Bld: 92
Glucose, Bld: 97
Potassium: 4.7

## 2011-02-20 LAB — CBC
HCT: 33.9 — ABNORMAL LOW
Hemoglobin: 12.2 — ABNORMAL LOW
MCHC: 32.8
MCHC: 33.5
MCV: 92.7
Platelets: 131 — ABNORMAL LOW
RBC: 4.06 — ABNORMAL LOW
WBC: 8.2

## 2011-02-20 LAB — URINALYSIS, ROUTINE W REFLEX MICROSCOPIC
Bilirubin Urine: NEGATIVE
Ketones, ur: NEGATIVE
Leukocytes, UA: NEGATIVE
Protein, ur: 30 — AB
pH: 5.5

## 2011-02-20 LAB — POCT CARDIAC MARKERS: CKMB, poc: 1.5

## 2014-05-10 ENCOUNTER — Encounter: Payer: Self-pay | Admitting: Nurse Practitioner

## 2014-05-10 NOTE — Progress Notes (Signed)
This encounter was created in error - please disregard.
